# Patient Record
Sex: Male | Born: 1960 | Race: Black or African American | Hispanic: No | Smoking: Never smoker
Health system: Southern US, Community
[De-identification: ages and names within clinical notes are randomized; demographics above are authoritative.]

## PROBLEM LIST (undated history)

## (undated) DIAGNOSIS — T7840XA Allergy, unspecified, initial encounter: Secondary | ICD-10-CM

## (undated) HISTORY — PX: WISDOM TOOTH EXTRACTION: SHX21

## (undated) HISTORY — DX: Allergy, unspecified, initial encounter: T78.40XA

---

## 2013-10-20 HISTORY — PX: ANTERIOR CRUCIATE LIGAMENT REPAIR: SHX115

## 2014-04-30 ENCOUNTER — Emergency Department (HOSPITAL_BASED_OUTPATIENT_CLINIC_OR_DEPARTMENT_OTHER)
Admission: EM | Admit: 2014-04-30 | Discharge: 2014-04-30 | Disposition: A | Discharge status: Home / Self Care | Payer: BC Managed Care – PPO | Attending: Emergency Medicine | Admitting: Emergency Medicine

## 2014-04-30 ENCOUNTER — Emergency Department (HOSPITAL_BASED_OUTPATIENT_CLINIC_OR_DEPARTMENT_OTHER): Payer: BC Managed Care – PPO

## 2014-04-30 ENCOUNTER — Encounter (HOSPITAL_BASED_OUTPATIENT_CLINIC_OR_DEPARTMENT_OTHER): Payer: Self-pay | Admitting: Emergency Medicine

## 2014-04-30 DIAGNOSIS — IMO0002 Reserved for concepts with insufficient information to code with codable children: Secondary | ICD-10-CM | POA: Insufficient documentation

## 2014-04-30 DIAGNOSIS — S8392XA Sprain of unspecified site of left knee, initial encounter: Secondary | ICD-10-CM

## 2014-04-30 DIAGNOSIS — Y9351 Activity, roller skating (inline) and skateboarding: Secondary | ICD-10-CM | POA: Insufficient documentation

## 2014-04-30 DIAGNOSIS — M79669 Pain in unspecified lower leg: Secondary | ICD-10-CM

## 2014-04-30 DIAGNOSIS — Y929 Unspecified place or not applicable: Secondary | ICD-10-CM | POA: Insufficient documentation

## 2014-04-30 MED ORDER — IBUPROFEN 800 MG PO TABS: 800.0000 mg | ORAL_TABLET | Freq: Three times a day (TID) | ORAL | Status: DC

## 2014-04-30 NOTE — ED Provider Notes (Signed)
CSN: [id number]     Arrival date & time 04/30/14  1933 History   First MD Initiated Contact with Patient 04/30/14 1936     Chief Complaint  Patient presents with  . Fall     (Consider location/radiation/quality/duration/timing/severity/associated sxs/prior Treatment) Patient is a 53 y.o. male presenting with fall. The history is provided by the patient. No language interpreter was used.  Fall This is a new problem. The current episode started today. The problem occurs constantly. The problem has been rapidly worsening. Pertinent negatives include no joint swelling. Nothing aggravates the symptoms. He has tried nothing for the symptoms. The treatment provided no relief.  Pt  twisted lower leg on a skate board.  Pt complains of pain in his leg, below knee on outer aspect.   Knee feels like it gives away when he walks  History reviewed. No pertinent past medical history. History reviewed. No pertinent past surgical history. History reviewed. No pertinent family history. History  Substance Use Topics  . Smoking status: Never Smoker   . Smokeless tobacco: Not on file  . Alcohol Use: No    Review of Systems  Musculoskeletal: Negative for joint swelling.  All other systems reviewed and are negative.     Allergies  Review of patient's allergies indicates no known allergies.  Home Medications   Prior to Admission medications   Not on File   BP 146/94  Pulse 107  Temp(Src) 98 F (36.7 C) (Oral)  Resp 18  Ht 5\' 4"  (1.626 m)  Wt 190 lb (86.183 kg)  BMI 32.60 kg/m2  SpO2 100% Physical Exam  Vitals reviewed. Constitutional: He is oriented to person, place, and time. He appears well-developed and well-nourished.  Musculoskeletal: Normal range of motion. He exhibits tenderness.  Tender proximal lateral leg,  belows knee,  No effusion,   No med/lat loosening,  Negative drawer and Lachman'snv and ns intact  Neurological: He is alert and oriented to person, place, and time. He has  normal reflexes.  Skin: Skin is warm.  Psychiatric: He has a normal mood and affect.    ED Course  Procedures (including critical care time) Labs Review Labs Reviewed - No data to display  Imaging Review Dg Knee Complete 4 Views Left  04/30/2014   CLINICAL DATA:  Fall off skateboard.  Fell and twisted knee.  EXAM: LEFT KNEE - COMPLETE 4+ VIEW  COMPARISON:  None.  FINDINGS: Bandage material rounded distal femur. The knee is aligned. No evidence of joint effusion or fracture. Joint spaces are maintained. No focal soft tissue swelling is appreciated radiographically.  IMPRESSION: No acute bony abnormality or joint effusion.   Electronically Signed   By: Britta MccreedySusan  Turner M.D.   On: 04/30/2014 19:54     EKG Interpretation None      MDM   Final diagnoses:  Calf tenderness  Knee sprain and strain, left, initial encounter    Knee imbolizer Crutches Ice  Elevate See Dr. Shelle IronBeane for recheck this week    Elson AreasLeslie K Blenda Wisecup, PA-C 04/30/14 2032

## 2014-04-30 NOTE — ED Provider Notes (Signed)
Medical screening examination/treatment/procedure(s) were performed by non-physician practitioner and as supervising physician I was immediately available for consultation/collaboration.   EKG Interpretation None        Khiley Lieser, MD 04/30/14 2342 

## 2014-04-30 NOTE — ED Notes (Signed)
Pt reports he was skateboarding today and fell - c/o pain to left knee, reports difficulty applying weight.

## 2014-04-30 NOTE — Discharge Instructions (Signed)
Combined Knee Ligament Sprain °Combined knee ligament sprain is a tear of more than one of the major ligaments of the knee. The four knee ligaments are the anterior cruciate ligament (ACL), posterior cruciate ligament (PCL), medial collateral ligament (MCL) and lateral collateral ligament (LCL). Ligaments connect bones. They often cross a joint to hold the bones together. The ligaments of the knee keep the thigh bone (femur) and shinbone (tibia) in alignment. These ligaments allow the joint to move within a certain range of motion. Movement outside this range causes a ligament strain. Injury to multiple ligaments at the same time results in difficulty playing sports and in daily living. The most common multiple knee ligament injury involves the ACL and MCL. °SYMPTOMS  °· A "popping" sound heard or felt at the time of injury. °· Inability to continue activity after injury. °· Inflammation of the knee within 6 hours after injury. °· Possibly, deformity of the knee. °· Inability to straighten the knee. °· Feeling of the knee giving way or buckling. °· Sometimes, locking of the knee, if the joint cartilage (meniscus) is injured. °· Rarely, numbness, weakness, paralysis, discoloration, or coldness, due to nerve or blood vessel injury. °CAUSES  °Spraining of multiple ligaments occurs when a force is placed on the ligaments that exceeds their strength. This is often caused by a direct hit (trauma). It may also be caused by a non-contact injury (hyperextending the knee while twisting it).  °RISK INCREASES WITH: °· Contact sports (football, rugby, lacrosse). Sports that involve pivoting, jumping, cutting, or changing direction (basketball, gymnastics, soccer, volleyball). Sports on uneven ground (cross-country running, soccer). °· Poor strength and/or flexibility. °· Improper fitted or padded equipment. °PREVENTION °· Warm up and stretch properly before activity. °· Maintain physical fitness: °¨ Thigh, leg, and knee  flexibility. °¨ Muscle strength and endurance. °· Learn and use proper exercise technique. °· Wear proper and well fitting equipment (correct length of cleats for surface). °PROGNOSIS  °Without treatment, the knee will continue to give way and become vulnerable to recurring injury. Recurring injury can happen during athletics or daily living. If the injury includes damage to a nerve or artery, the chance of a poor outcome increases. Surgery is often needed to regain stability of the knee. °RELATED COMPLICATIONS  °Frequently recurring symptoms, including: °· Knee giving way. °· Joint instability. °· Inflammation. °· Injury to the joint cartilage (meniscus). This may result in locking and/or swelling of the knee. °· Injury to joint (articular) cartilage of the thigh bone or shinbone. This may result in arthritis of the knee. °· Injury to other ligaments of the knee. °· Knee stiffness (loss of knee motion). °· Permanent injury to nerves (numbness, weakness, or paralysis) or arteries. °· Removal (amputation) of the leg, due to nerve or artery injury. °TREATMENT  °Treatment first involves medicine and ice, to reduce pain and inflammation. Crutches may be advised, to decrease pain while walking. The knee may be restrained. Rehabilitation focuses on reducing swelling, regaining range of motion, and regaining muscle control and strength. It may also include receiving proper use training, wearing a brace, and education. (Avoid sports that involve pivoting, cutting, changing direction, jumping and landing). Surgery often offers the best chance for full recovery. Surgery from combined ACL/MCL injury involves replacement (reconstruction) of the ACL. This also allows for MCL healing. Despite surgery, some athletes may never return to their prior level of competition. The ability to return to sports depends on the related injuries and demands of the sport.  °MEDICATION  °·   If pain medicine is needed, nonsteroidal  anti-inflammatory medicines (aspirin and ibuprofen), or other minor pain relievers (acetaminophen), are often advised. °· Do not take pain medicine for 7 days before surgery. °· Stronger pain relievers may be prescribed. Use only as directed and only as much as you need. °· Contact your caregiver immediately if any bleeding, stomach upset, or signs of an allergic reaction occur. °COLD THERAPY  °Cold treatment (icing) should be applied for 10 to 15 minutes every 2 to 3 hours for inflammation and pain, and immediately after activity that aggravates your symptoms. Use ice packs or an ice massage. °SEEK MEDICAL CARE IF:  °· Symptoms get worse or do not improve in 6 weeks, despite treatment. °· After injury or surgery, any of the following occur: °¨ Pain, numbness, coldness, or a blue, gray, or dark color occurs in the foot or toenails. °¨ Increased pain, swelling, redness, drainage of fluids, or bleeding in the affected area. °¨ Signs of infection (headache, muscle aches, dizziness, or a general ill feeling with fever). °¨ New, unexplained symptoms develop. (Drugs used in treatment may produce side effects.) °Document Released: 10/06/2005 Document Revised: 12/29/2011 Document Reviewed: 01/18/2009 °Exit[employer]® Patient Information ©2015 [employer]. This information is not intended to replace advice given to you by your health care provider. Make sure you discuss any questions you have with your health care provider. ° °

## 2014-05-06 ENCOUNTER — Ambulatory Visit (HOSPITAL_BASED_OUTPATIENT_CLINIC_OR_DEPARTMENT_OTHER)
Admission: RE | Admit: 2014-05-06 | Discharge: 2014-05-06 | Disposition: A | Discharge status: Home / Self Care | Payer: BC Managed Care – PPO | Source: Ambulatory Visit | Attending: Physician Assistant | Admitting: Physician Assistant

## 2014-05-06 DIAGNOSIS — S83509A Sprain of unspecified cruciate ligament of unspecified knee, initial encounter: Secondary | ICD-10-CM | POA: Insufficient documentation

## 2014-05-06 DIAGNOSIS — M25469 Effusion, unspecified knee: Secondary | ICD-10-CM | POA: Diagnosis not present

## 2014-05-06 DIAGNOSIS — Y9351 Activity, roller skating (inline) and skateboarding: Secondary | ICD-10-CM | POA: Diagnosis not present

## 2014-05-06 DIAGNOSIS — IMO0002 Reserved for concepts with insufficient information to code with codable children: Secondary | ICD-10-CM | POA: Diagnosis not present

## 2014-05-06 DIAGNOSIS — X500XXA Overexertion from strenuous movement or load, initial encounter: Secondary | ICD-10-CM | POA: Diagnosis not present

## 2014-05-11 ENCOUNTER — Ambulatory Visit: Payer: BC Managed Care – PPO | Attending: Orthopedic Surgery | Admitting: Rehabilitation

## 2014-05-11 DIAGNOSIS — R262 Difficulty in walking, not elsewhere classified: Secondary | ICD-10-CM | POA: Diagnosis not present

## 2014-05-11 DIAGNOSIS — M25569 Pain in unspecified knee: Secondary | ICD-10-CM | POA: Insufficient documentation

## 2014-05-11 DIAGNOSIS — S83509A Sprain of unspecified cruciate ligament of unspecified knee, initial encounter: Secondary | ICD-10-CM | POA: Diagnosis not present

## 2014-05-11 DIAGNOSIS — X58XXXA Exposure to other specified factors, initial encounter: Secondary | ICD-10-CM | POA: Diagnosis not present

## 2014-05-11 DIAGNOSIS — IMO0001 Reserved for inherently not codable concepts without codable children: Secondary | ICD-10-CM | POA: Diagnosis present

## 2014-06-07 ENCOUNTER — Ambulatory Visit: Payer: BC Managed Care – PPO | Attending: Orthopedic Surgery | Admitting: Rehabilitation

## 2014-06-07 DIAGNOSIS — R262 Difficulty in walking, not elsewhere classified: Secondary | ICD-10-CM | POA: Insufficient documentation

## 2014-06-07 DIAGNOSIS — S83509A Sprain of unspecified cruciate ligament of unspecified knee, initial encounter: Secondary | ICD-10-CM | POA: Insufficient documentation

## 2014-06-07 DIAGNOSIS — M25569 Pain in unspecified knee: Secondary | ICD-10-CM | POA: Diagnosis not present

## 2014-06-07 DIAGNOSIS — X58XXXA Exposure to other specified factors, initial encounter: Secondary | ICD-10-CM | POA: Insufficient documentation

## 2014-06-07 DIAGNOSIS — IMO0001 Reserved for inherently not codable concepts without codable children: Secondary | ICD-10-CM | POA: Insufficient documentation

## 2014-06-12 ENCOUNTER — Ambulatory Visit: Payer: BC Managed Care – PPO | Admitting: Rehabilitation

## 2014-06-12 DIAGNOSIS — IMO0001 Reserved for inherently not codable concepts without codable children: Secondary | ICD-10-CM | POA: Diagnosis not present

## 2014-06-14 ENCOUNTER — Ambulatory Visit: Payer: BC Managed Care – PPO

## 2014-06-14 DIAGNOSIS — IMO0001 Reserved for inherently not codable concepts without codable children: Secondary | ICD-10-CM | POA: Diagnosis not present

## 2014-06-16 ENCOUNTER — Ambulatory Visit: Payer: BC Managed Care – PPO | Admitting: Rehabilitation

## 2014-06-16 DIAGNOSIS — IMO0001 Reserved for inherently not codable concepts without codable children: Secondary | ICD-10-CM | POA: Diagnosis not present

## 2014-06-19 ENCOUNTER — Ambulatory Visit: Payer: BC Managed Care – PPO | Admitting: Rehabilitation

## 2014-06-21 ENCOUNTER — Ambulatory Visit: Payer: BC Managed Care – PPO | Attending: Orthopedic Surgery | Admitting: Rehabilitation

## 2014-06-21 DIAGNOSIS — Z9889 Other specified postprocedural states: Secondary | ICD-10-CM | POA: Insufficient documentation

## 2014-06-21 DIAGNOSIS — M25569 Pain in unspecified knee: Secondary | ICD-10-CM | POA: Diagnosis not present

## 2014-06-21 DIAGNOSIS — R262 Difficulty in walking, not elsewhere classified: Secondary | ICD-10-CM | POA: Diagnosis not present

## 2014-06-21 DIAGNOSIS — IMO0001 Reserved for inherently not codable concepts without codable children: Secondary | ICD-10-CM | POA: Insufficient documentation

## 2014-06-23 ENCOUNTER — Ambulatory Visit: Payer: BC Managed Care – PPO | Admitting: Rehabilitation

## 2014-06-23 DIAGNOSIS — IMO0001 Reserved for inherently not codable concepts without codable children: Secondary | ICD-10-CM | POA: Diagnosis not present

## 2014-06-27 ENCOUNTER — Ambulatory Visit: Payer: BC Managed Care – PPO | Admitting: Physical Therapy

## 2014-06-27 DIAGNOSIS — IMO0001 Reserved for inherently not codable concepts without codable children: Secondary | ICD-10-CM | POA: Diagnosis not present

## 2014-06-29 ENCOUNTER — Ambulatory Visit: Payer: BC Managed Care – PPO | Admitting: Rehabilitation

## 2014-06-29 DIAGNOSIS — IMO0001 Reserved for inherently not codable concepts without codable children: Secondary | ICD-10-CM | POA: Diagnosis not present

## 2014-06-30 ENCOUNTER — Encounter: Payer: BC Managed Care – PPO | Admitting: Rehabilitation

## 2014-07-05 ENCOUNTER — Ambulatory Visit: Payer: BC Managed Care – PPO | Admitting: Physical Therapy

## 2014-07-07 ENCOUNTER — Ambulatory Visit: Payer: BC Managed Care – PPO | Admitting: Rehabilitation

## 2014-07-07 DIAGNOSIS — IMO0001 Reserved for inherently not codable concepts without codable children: Secondary | ICD-10-CM | POA: Diagnosis not present

## 2018-10-25 ENCOUNTER — Ambulatory Visit: Payer: BC Managed Care – PPO | Admitting: Family Medicine

## 2018-10-25 ENCOUNTER — Encounter: Payer: Self-pay | Admitting: Family Medicine

## 2018-10-25 VITALS — BP 112/84 | HR 77 | Temp 97.6°F | Ht 64.5 in | Wt 200.2 lb

## 2018-10-25 DIAGNOSIS — Z0000 Encounter for general adult medical examination without abnormal findings: Secondary | ICD-10-CM

## 2018-10-25 DIAGNOSIS — Z1211 Encounter for screening for malignant neoplasm of colon: Secondary | ICD-10-CM | POA: Diagnosis not present

## 2018-10-25 DIAGNOSIS — Z114 Encounter for screening for human immunodeficiency virus [HIV]: Secondary | ICD-10-CM | POA: Diagnosis not present

## 2018-10-25 DIAGNOSIS — Z125 Encounter for screening for malignant neoplasm of prostate: Secondary | ICD-10-CM

## 2018-10-25 DIAGNOSIS — Z1159 Encounter for screening for other viral diseases: Secondary | ICD-10-CM

## 2018-10-25 LAB — COMPREHENSIVE METABOLIC PANEL
ALT: 22 U/L (ref 0–53)
AST: 18 U/L (ref 0–37)
Albumin: 4.5 g/dL (ref 3.5–5.2)
Alkaline Phosphatase: 73 U/L (ref 39–117)
BUN: 14 mg/dL (ref 6–23)
CHLORIDE: 101 meq/L (ref 96–112)
CO2: 29 mEq/L (ref 19–32)
CREATININE: 0.98 mg/dL (ref 0.40–1.50)
Calcium: 9.2 mg/dL (ref 8.4–10.5)
GFR: 101.78 mL/min (ref >60.00)
Glucose, Bld: 103 mg/dL — ABNORMAL HIGH (ref 70–99)
Potassium: 4.4 mEq/L (ref 3.5–5.1)
SODIUM: 137 meq/L (ref 135–145)
Total Bilirubin: 0.5 mg/dL (ref 0.2–1.2)
Total Protein: 7.1 g/dL (ref 6.0–8.3)

## 2018-10-25 LAB — PSA: PSA: 1.76 ng/mL (ref 0.10–4.00)

## 2018-10-25 LAB — LIPID PANEL
Cholesterol: 260 mg/dL — ABNORMAL HIGH (ref 0–200)
HDL: 46.6 mg/dL (ref >39.00)
LDL CALC: 189 mg/dL — AB (ref 0–99)
NONHDL: 213.43
Total CHOL/HDL Ratio: 6
Triglycerides: 122 mg/dL (ref 0.0–149.0)
VLDL: 24.4 mg/dL (ref 0.0–40.0)

## 2018-10-25 MED ORDER — TRIAMCINOLONE ACETONIDE 0.1 % EX CREA: TOPICAL_CREAM | CUTANEOUS | 0 refills | Status: DC

## 2018-10-25 NOTE — Patient Instructions (Addendum)
Give [place] 2-3 business days to get the results of your labs back.   Keep the diet clean and stay active.  The new Shingrix vaccine (for shingles) is a 2 shot series. It can make people feel low energy, achy and almost like they have the flu for 48 hours after injection. Please plan accordingly when deciding on when to get this shot. Call our office for a nurse visit appointment to get this. The second shot of the series is less severe regarding the side effects, but it still lasts 48 hours.   If you do not hear anything about your referral in the next 1-2 weeks, call our office and ask for an update.  Keep tabs of the darker lesion on your finger. If there are any changes, let me know. Take a picture for reference. We have a picture in your file from today also.   Try the cream on area just inside the R ear.   Let us know if you need anything.

## 2018-10-25 NOTE — Progress Notes (Signed)
Chief Complaint  Patient presents with  . New Patient (Initial Visit)   Well Male Willie Vargas is here for a complete physical.   His last physical was >1 year ago.  Current diet: in general, a "healthy" diet.  Current exercise: none; active outside Weight trend: gained 10 lbs over past year Does pt snore? No. Daytime fatigue? No. Seat belt? Yes.    Health maintenance Shingrix- No Colonoscopy- No Tetanus- Yes HIV- No Hep C- No   Past Medical History:  Diagnosis Date  . Allergy       Past Surgical History:  Procedure Laterality Date  . ANTERIOR CRUCIATE LIGAMENT REPAIR  2015    Medications  Takes no meds routinely.    Allergies No Known Allergies  Family History Family History  Problem Relation Age of Onset  . COPD Mother   . Diabetes Mother   . Hyperlipidemia Mother   . Hypertension Mother   . Alcohol abuse Father   . Drug abuse Father   . Early death Father   . Heart disease Father   . Arthritis Maternal Grandmother   . Diabetes Maternal Grandmother   . Hypertension Maternal Grandmother   . Arthritis Paternal Grandmother   . Early death Paternal Grandmother   . Heart disease Paternal Grandmother     Review of Systems: Constitutional:  no fevers Eye:  no recent significant change in vision Ear/Nose/Mouth/Throat:  Ears:  no hearing loss Nose/Mouth/Throat:  no complaints of nasal congestion, no sore throat Cardiovascular:  no chest pain, no palpitations Respiratory:  no cough and no shortness of breath Gastrointestinal:  no abdominal pain, no change in bowel habits GU:  Male: negative for dysuria, frequency, and incontinence and negative for prostate symptoms Musculoskeletal/Extremities:  no pain, redness, or swelling of the joints Integumentary (Skin/Breast):  no abnormal skin lesions reported Neurologic:  no headaches Endocrine: No unexpected weight changes Hematologic/Lymphatic:  no abnormal bleeding  Exam BP 112/84 (BP Location: Left Arm,  Patient Position: Sitting, Cuff Size: Large)   Pulse 77   Temp 97.6 F (36.4 C) (Oral)   Ht 5' 4.5" (1.638 m)   Wt 200 lb 4 oz (90.8 kg)   SpO2 99%   BMI 33.84 kg/m  General:  well developed, well nourished, in no apparent distress Skin:  no significant moles, warts, or growths Head:  no masses, lesions, or tenderness Eyes:  pupils equal and round, sclera anicteric without injection Ears:  canals without lesions, TMs shiny without retraction, no obvious effusion, no erythema Nose:  nares patent, septum midline, mucosa normal Throat/Pharynx:  lips and gingiva without lesion; tongue and uvula midline; non-inflamed pharynx; no exudates or postnasal drainage Neck: neck supple without adenopathy, thyromegaly, or masses Lungs:  clear to auscultation, breath sounds equal bilaterally, no respiratory distress Cardio:  regular rate and rhythm, no LE edema, no bruits Abdomen:  abdomen soft, nontender; bowel sounds normal; no masses or organomegaly Genital (male): Uncircumcised penis, no lesions or discharge; testes present bilaterally without masses or tenderness Rectal: Deferred Musculoskeletal:  symmetrical muscle groups noted without atrophy or deformity Extremities:  no clubbing, cyanosis, or edema, no deformities, no skin discoloration Neuro:  gait normal; deep tendon reflexes normal and symmetric Psych: well oriented with normal range of affect and appropriate judgment/insight   L lateral 3rd digit  Assessment and Plan  Well adult exam - Plan: Comprehensive metabolic panel, Lipid panel  Screen for colon cancer - Plan: Ambulatory referral to Gastroenterology  Screening for HIV (human immunodeficiency virus) -  Plan: HIV Antibody (routine testing w rflx)  Screening for prostate cancer - Plan: PSA  Encounter for hepatitis C screening test for low risk patient - Plan: Hepatitis C antibody   Well 58 y.o. male. Counseled on diet and exercise. Counseled on risks and benefits of  prostate cancer screening with PSA. The patient agrees to undergo testing. Shingrix info given.  Immunizations, labs, and further orders as above. Follow up in 1 yr. The patient voiced understanding and agreement to the plan.  Jilda Roche [place], DO 10/25/18 10:02 AM

## 2018-10-25 NOTE — Progress Notes (Signed)
Pre visit review using our clinic review tool, if applicable. No additional management support is needed unless otherwise documented below in the visit note. 

## 2018-10-26 LAB — HIV ANTIBODY (ROUTINE TESTING W REFLEX): HIV 1&2 Ab, 4th Generation: NONREACTIVE

## 2018-10-26 LAB — HEPATITIS C ANTIBODY
HEP C AB: NONREACTIVE
SIGNAL TO CUT-OFF: 0.08 (ref <1.00)

## 2018-10-27 ENCOUNTER — Other Ambulatory Visit: Payer: Self-pay | Admitting: Family Medicine

## 2018-10-27 DIAGNOSIS — R739 Hyperglycemia, unspecified: Secondary | ICD-10-CM

## 2018-10-27 DIAGNOSIS — E782 Mixed hyperlipidemia: Secondary | ICD-10-CM

## 2018-11-11 ENCOUNTER — Encounter: Payer: Self-pay | Admitting: Gastroenterology

## 2018-11-11 ENCOUNTER — Encounter: Payer: Self-pay | Admitting: Family Medicine

## 2018-11-17 ENCOUNTER — Other Ambulatory Visit: Payer: Self-pay | Admitting: Family Medicine

## 2018-11-17 DIAGNOSIS — Z1211 Encounter for screening for malignant neoplasm of colon: Secondary | ICD-10-CM

## 2018-11-29 ENCOUNTER — Other Ambulatory Visit (INDEPENDENT_AMBULATORY_CARE_PROVIDER_SITE_OTHER): Payer: BC Managed Care – PPO

## 2018-11-29 ENCOUNTER — Other Ambulatory Visit: Payer: Self-pay | Admitting: Family Medicine

## 2018-11-29 DIAGNOSIS — R739 Hyperglycemia, unspecified: Secondary | ICD-10-CM

## 2018-11-29 DIAGNOSIS — E782 Mixed hyperlipidemia: Secondary | ICD-10-CM

## 2018-11-29 DIAGNOSIS — R7303 Prediabetes: Secondary | ICD-10-CM

## 2018-11-29 DIAGNOSIS — E785 Hyperlipidemia, unspecified: Secondary | ICD-10-CM

## 2018-11-29 LAB — LIPID PANEL
CHOL/HDL RATIO: 6
Cholesterol: 212 mg/dL — ABNORMAL HIGH (ref 0–200)
HDL: 35.3 mg/dL — ABNORMAL LOW (ref >39.00)
LDL Cholesterol: 158 mg/dL — ABNORMAL HIGH (ref 0–99)
NonHDL: 177.11
Triglycerides: 96 mg/dL (ref 0.0–149.0)
VLDL: 19.2 mg/dL (ref 0.0–40.0)

## 2018-11-29 LAB — HEMOGLOBIN A1C: HEMOGLOBIN A1C: 6 % (ref 4.6–6.5)

## 2018-12-02 ENCOUNTER — Ambulatory Visit (AMBULATORY_SURGERY_CENTER): Payer: Self-pay | Admitting: *Deleted

## 2018-12-02 ENCOUNTER — Encounter: Payer: Self-pay | Admitting: Gastroenterology

## 2018-12-02 VITALS — Ht 64.0 in | Wt 200.0 lb

## 2018-12-02 DIAGNOSIS — Z1211 Encounter for screening for malignant neoplasm of colon: Secondary | ICD-10-CM

## 2018-12-02 MED ORDER — NA SULFATE-K SULFATE-MG SULF 17.5-3.13-1.6 GM/177ML PO SOLN: ORAL | 0 refills | Status: DC

## 2018-12-02 NOTE — Progress Notes (Signed)
Patient denies any allergies to eggs or soy. Patient denies any problems with anesthesia/sedation. Patient denies any oxygen use at home. Patient denies taking any diet/weight loss medications or blood thinners. EMMI education offered, pt declined. suprep $15 off coupon printed and given to pt.

## 2018-12-07 ENCOUNTER — Ambulatory Visit (INDEPENDENT_AMBULATORY_CARE_PROVIDER_SITE_OTHER): Payer: BC Managed Care – PPO | Admitting: *Deleted

## 2018-12-07 ENCOUNTER — Ambulatory Visit: Payer: BC Managed Care – PPO

## 2018-12-07 DIAGNOSIS — Z23 Encounter for immunization: Secondary | ICD-10-CM | POA: Diagnosis not present

## 2018-12-07 NOTE — Progress Notes (Signed)
Patient here for tdap vaccine  Immunization given and patient tolerated well.

## 2018-12-16 ENCOUNTER — Ambulatory Visit (AMBULATORY_SURGERY_CENTER): Payer: BC Managed Care – PPO | Admitting: Gastroenterology

## 2018-12-16 ENCOUNTER — Encounter: Payer: Self-pay | Admitting: Gastroenterology

## 2018-12-16 ENCOUNTER — Other Ambulatory Visit: Payer: Self-pay

## 2018-12-16 VITALS — BP 167/94 | HR 66 | Temp 97.3°F | Resp 16 | Ht 64.0 in | Wt 200.0 lb

## 2018-12-16 DIAGNOSIS — Z1211 Encounter for screening for malignant neoplasm of colon: Secondary | ICD-10-CM

## 2018-12-16 MED ORDER — SODIUM CHLORIDE 0.9 % IV SOLN: 500.0000 mL | Freq: Once | INTRAVENOUS | Status: DC

## 2018-12-16 NOTE — Patient Instructions (Signed)
  HANDOUTS GIVEN: DIVERTICULOSIS AND HIGH FIBER dIET   YOU HAD AN ENDOSCOPIC PROCEDURE TODAY AT THE [facility]:   Refer to the procedure report that was given to you for any specific questions about what was found during the examination.  If the procedure report does not answer your questions, please call your gastroenterologist to clarify.  If you requested that your care partner not be given the details of your procedure findings, then the procedure report has been included in a sealed envelope for you to review at your convenience later.  YOU SHOULD EXPECT: Some feelings of bloating in the abdomen. Passage of more gas than usual.  Walking can help get rid of the air that was put into your GI tract during the procedure and reduce the bloating. If you had a lower endoscopy (such as a colonoscopy or flexible sigmoidoscopy) you may notice spotting of blood in your stool or on the toilet paper. If you underwent a bowel prep for your procedure, you may not have a normal bowel movement for a few days.  Please Note:  You might notice some irritation and congestion in your nose or some drainage.  This is from the oxygen used during your procedure.  There is no need for concern and it should clear up in a day or so.  SYMPTOMS TO REPORT IMMEDIATELY:   Following lower endoscopy (colonoscopy or flexible sigmoidoscopy):  Excessive amounts of blood in the stool  Significant tenderness or worsening of abdominal pains  Swelling of the abdomen that is new, acute  Fever of 100F or higher   For urgent or emergent issues, a gastroenterologist can be reached at any hour by calling [phone].   DIET:  We do recommend a small meal at first, but then you may proceed to your regular diet.  Drink plenty of fluids but you should avoid alcoholic beverages for 24 hours.  ACTIVITY:  You should plan to take it easy for the rest of today and you should NOT DRIVE or use heavy machinery until tomorrow  (because of the sedation medicines used during the test).    FOLLOW UP: Our staff will call the number listed on your records the next business day following your procedure to check on you and address any questions or concerns that you may have regarding the information given to you following your procedure. If we do not reach you, we will leave a message.  However, if you are feeling well and you are not experiencing any problems, there is no need to return our call.  We will assume that you have returned to your regular daily activities without incident.  If any biopsies were taken you will be contacted by phone or by letter within the next 1-3 weeks.  Please call us at [phone] if you have not heard about the biopsies in 3 weeks.    SIGNATURES/CONFIDENTIALITY: You and/or your care partner have signed paperwork which will be entered into your electronic medical record.  These signatures attest to the fact that that the information above on your After Visit Summary has been reviewed and is understood.  Full responsibility of the confidentiality of this discharge information lies with you and/or your care-partner.

## 2018-12-16 NOTE — Progress Notes (Signed)
Report to PACU, RN, vss, BBS= Clear.  

## 2018-12-16 NOTE — Op Note (Signed)
[facility] Patient Name: Willie PaganiniWayne Vargas Procedure Date: 12/16/2018 8:26 AM MRN: [id number] Endoscopist: Lynann Bolognaajesh Crosby Bevan , MD Age: 58 Referring MD:  Date of Birth: 03/26/1961 Gender: Male Account #: [id number] Procedure:                Colonoscopy Indications:              Screening for colorectal malignant neoplasm Medicines:                Monitored Anesthesia Care Procedure:                Pre-Anesthesia Assessment:                           - Prior to the procedure, a History and Physical                            was performed, and patient medications and                            allergies were reviewed. The patient's tolerance of                            previous anesthesia was also reviewed. The risks                            and benefits of the procedure and the sedation                            options and risks were discussed with the patient.                            All questions were answered, and informed consent                            was obtained. Prior Anticoagulants: The patient has                            taken no previous anticoagulant or antiplatelet                            agents. ASA Grade Assessment: I - A normal, healthy                            patient. After reviewing the risks and benefits,                            the patient was deemed in satisfactory condition to                            undergo the procedure.                           After obtaining informed consent, the colonoscope  was passed under direct vision. Throughout the                            procedure, the patient's blood pressure, pulse, and                            oxygen saturations were monitored continuously. The                            Model CF-HQ190L [id number]) scope was introduced                            through the anus and advanced to the 2 cm into the                            ileum. The colonoscopy was  performed without                            difficulty. The patient tolerated the procedure                            well. The quality of the bowel preparation was                            excellent. Scope In: 8:30:58 AM Scope Out: 8:44:40 AM Scope Withdrawal Time: 0 hours 9 minutes 34 seconds  Total Procedure Duration: 0 hours 13 minutes 42 seconds  Findings:                 A few small-mouthed diverticula were found in the                            sigmoid colon, descending colon and ascending colon.                           Minimal internal hemorrhoids.                           The terminal ileum appeared normal.                           The exam was otherwise without abnormality on                            direct and retroflexion views. Complications:            No immediate complications. Estimated Blood Loss:     Estimated blood loss: none. Impression:               -Mild pancolonic diverticulosis predominantly in                            the left colon.                           -Otherwise normal colonoscopy to TI. Recommendation:           -  Patient has a contact number available for                            emergencies. The signs and symptoms of potential                            delayed complications were discussed with the                            patient. Return to normal activities tomorrow.                            Written discharge instructions were provided to the                            patient.                           - High fiber diet.                           - Continue present medications.                           - Repeat colonoscopy in 10 years for screening                            purposes. Earlier, if with any new problems or if                            there is any change in family history.                           - Return to GI clinic PRN. Lynann Bologna, MD 12/16/2018 8:49:10 AM This report has been signed electronically.

## 2018-12-16 NOTE — Progress Notes (Signed)
Pt's states no medical or surgical changes since previsit or office visit. OK for student to be in procedure room.

## 2018-12-17 ENCOUNTER — Telehealth: Payer: Self-pay

## 2018-12-17 NOTE — Telephone Encounter (Signed)
  Follow up Call-  Call back number 12/16/2018  Post procedure Call Back phone  # [phone]  Permission to leave phone message Yes  Some recent data might be hidden     Patient questions: 2Do you have a fever, pain , or abdominal swelling? No. Pain Score  0 *  Have you tolerated food without any problems? Yes.    Have you been able to return to your normal activities? Yes.    Do you have any questions about your discharge instructions: Diet   No. Medications  No. Follow up visit  No.  Do you have questions or concerns about your Care? No.  Actions: * If pain score is 4 or above: No action needed, pain <4.

## 2019-03-30 ENCOUNTER — Other Ambulatory Visit: Payer: Self-pay

## 2019-03-30 ENCOUNTER — Other Ambulatory Visit (INDEPENDENT_AMBULATORY_CARE_PROVIDER_SITE_OTHER): Payer: BC Managed Care – PPO

## 2019-03-30 DIAGNOSIS — E785 Hyperlipidemia, unspecified: Secondary | ICD-10-CM

## 2019-03-30 DIAGNOSIS — R7303 Prediabetes: Secondary | ICD-10-CM

## 2019-03-30 LAB — HEMOGLOBIN A1C: Hgb A1c MFr Bld: 6.3 % (ref 4.6–6.5)

## 2019-03-30 LAB — LIPID PANEL
Cholesterol: 216 mg/dL — ABNORMAL HIGH (ref 0–200)
HDL: 43.5 mg/dL (ref >39.00)
LDL Cholesterol: 159 mg/dL — ABNORMAL HIGH (ref 0–99)
NonHDL: 172.92
Total CHOL/HDL Ratio: 5
Triglycerides: 71 mg/dL (ref 0.0–149.0)
VLDL: 14.2 mg/dL (ref 0.0–40.0)

## 2019-05-18 ENCOUNTER — Other Ambulatory Visit: Payer: Self-pay

## 2019-05-18 ENCOUNTER — Ambulatory Visit: Payer: BC Managed Care – PPO | Admitting: Family Medicine

## 2019-05-18 ENCOUNTER — Encounter: Payer: Self-pay | Admitting: Family Medicine

## 2019-05-18 VITALS — BP 138/84 | HR 91 | Temp 97.9°F | Ht 64.5 in | Wt 199.4 lb

## 2019-05-18 DIAGNOSIS — R7303 Prediabetes: Secondary | ICD-10-CM

## 2019-05-18 DIAGNOSIS — L989 Disorder of the skin and subcutaneous tissue, unspecified: Secondary | ICD-10-CM | POA: Diagnosis not present

## 2019-05-18 DIAGNOSIS — E782 Mixed hyperlipidemia: Secondary | ICD-10-CM

## 2019-05-18 NOTE — Progress Notes (Signed)
Chief Complaint  Patient presents with  . Results    cholesterol    Subjective: Patient is a 58 y.o. male here for f/u labs.  Labs showed hyperlipidemia and a1c of 6.3 from 6.0. Since hearing this news, he has increased his physical activity and also cut down on sweets. He has lost a couple lbs, feels better.   Skin lesion on L forearm. No pain, sometimes sore but he does prod at it. No drainage, redness, known injury. Not changing. Would eventually like it off.   ROS: Heart: Denies chest pain  Lungs: Denies SOB   Past Medical History:  Diagnosis Date  . Allergy     Objective: BP 138/84 (BP Location: Left Arm, Patient Position: Sitting, Cuff Size: Large)   Pulse 91   Temp 97.9 F (36.6 C) (Oral)   Ht 5' 4.5" (1.638 m)   Wt 199 lb 6 oz (90.4 kg)   SpO2 95%   BMI 33.69 kg/m  General: Awake, appears stated age HEENT: MMM, EOMi Heart: RRR, no bruits, no LE edema Lungs: CTAB, no rales, wheezes or rhonchi. No accessory muscle use Skin: Flesh colored lesion that is elliptically shaped measuring 0.6 x 1.2 cm in diameter. No erythema, excessive warmth, fluctuance, drainage, scaling.  Psych: Age appropriate judgment and insight, normal affect and mood  Assessment and Plan: Prediabetes - Plan: Hemoglobin A1c, ck in 2 mo  Mixed hyperlipidemia - Plan: Lipid panel, ck in 2 mo  Skin lesion - Plan: offered removal vs referral to derm; he will let us know  F/u in 2 mo for labs; I will plan on things being improved given his dietary changes and have him in for CPE in Jan as originally scheduled.  The patient voiced understanding and agreement to the plan.  [place], DO 05/18/19  9:52 AM

## 2019-05-18 NOTE — Patient Instructions (Addendum)
Give [place] 2-3 business days to get the results of your labs back.   Keep the diet clean and stay active. Strong work.   If you want the area on your forearm taken care of, let us know. We can do it here or we can send you to the dermatologist.   Let us know if you need anything.

## 2019-07-20 ENCOUNTER — Other Ambulatory Visit: Payer: BC Managed Care – PPO

## 2019-08-16 ENCOUNTER — Other Ambulatory Visit: Payer: Self-pay

## 2019-08-16 ENCOUNTER — Ambulatory Visit (INDEPENDENT_AMBULATORY_CARE_PROVIDER_SITE_OTHER): Payer: BC Managed Care – PPO

## 2019-08-16 DIAGNOSIS — Z23 Encounter for immunization: Secondary | ICD-10-CM

## 2019-10-27 ENCOUNTER — Encounter: Payer: BC Managed Care – PPO | Admitting: Family Medicine

## 2019-10-31 ENCOUNTER — Other Ambulatory Visit: Payer: Self-pay

## 2019-10-31 ENCOUNTER — Other Ambulatory Visit (INDEPENDENT_AMBULATORY_CARE_PROVIDER_SITE_OTHER): Payer: BC Managed Care – PPO

## 2019-10-31 DIAGNOSIS — E782 Mixed hyperlipidemia: Secondary | ICD-10-CM

## 2019-10-31 DIAGNOSIS — R7303 Prediabetes: Secondary | ICD-10-CM

## 2019-10-31 LAB — LIPID PANEL
Cholesterol: 225 mg/dL — ABNORMAL HIGH (ref 0–200)
HDL: 42 mg/dL (ref >39.00)
LDL Cholesterol: 166 mg/dL — ABNORMAL HIGH (ref 0–99)
NonHDL: 182.64
Total CHOL/HDL Ratio: 5
Triglycerides: 85 mg/dL (ref 0.0–149.0)
VLDL: 17 mg/dL (ref 0.0–40.0)

## 2019-10-31 LAB — HEMOGLOBIN A1C: Hgb A1c MFr Bld: 6.2 % (ref 4.6–6.5)

## 2019-11-02 ENCOUNTER — Encounter: Payer: BC Managed Care – PPO | Admitting: Family Medicine

## 2019-11-02 DIAGNOSIS — Z0289 Encounter for other administrative examinations: Secondary | ICD-10-CM

## 2020-02-27 ENCOUNTER — Telehealth: Payer: Self-pay

## 2020-02-27 NOTE — Telephone Encounter (Addendum)
Patient was told by billing to give our office a call about a billing issue with our office. DOS 11/02/2019 for a physical for a no show. Per the patient he received a voice mail on his phone stating that Dr. Carmelia Roller would not be in office on the DOS 11/02/2019 Per the patient he never came in for the appointment and he received a bill for it. The patient has the voice mail still saved in his cell phone from that day on 11/01/2019 at 1 pm is when he received the message. The patient wants to talk to an office manager about this issue. Please call the patient back at [phone] as soon as possible.

## 2020-02-28 NOTE — Telephone Encounter (Signed)
Spoke with patient to let him know the message he received on 11/01/19 was stating that his visit for 11/02/19 was changed from an in person visit to a virtual office visit due to Covid (patient has [employer] and a CPE could be done virtually during that time).  I did let the patient know I would send over an email to charge correction asking them to remove the $50.00 no show fee for the 11/02/19 DOS.

## 2020-12-12 ENCOUNTER — Encounter: Payer: Self-pay | Admitting: Family Medicine

## 2020-12-12 ENCOUNTER — Other Ambulatory Visit: Payer: Self-pay

## 2020-12-12 ENCOUNTER — Ambulatory Visit: Payer: BC Managed Care – PPO | Admitting: Family Medicine

## 2020-12-12 VITALS — BP 150/92 | HR 57 | Temp 98.2°F | Ht 64.5 in | Wt 199.1 lb

## 2020-12-12 DIAGNOSIS — R000 Tachycardia, unspecified: Secondary | ICD-10-CM

## 2020-12-12 DIAGNOSIS — I10 Essential (primary) hypertension: Secondary | ICD-10-CM

## 2020-12-12 MED ORDER — AMLODIPINE BESYLATE 5 MG PO TABS: 5.0000 mg | ORAL_TABLET | Freq: Every day | ORAL | 3 refills | Status: DC

## 2020-12-12 NOTE — Patient Instructions (Signed)
Give [place] 2-3 business days to get the results of your labs back.   Keep the diet clean and stay active.  If you do not hear anything about your referral in the next 1-2 weeks, call our office and ask for an update.  Check your blood pressures 2-3 times per week, alternating the time of day you check it. If it is high, considering waiting 1-2 minutes and rechecking. If it gets higher, your anxiety is likely creeping up and we should avoid rechecking.   Let us know if you need anything.

## 2020-12-12 NOTE — Progress Notes (Signed)
Chief Complaint  Patient presents with  . Hypertension    Subjective [name] is a 60 y.o. male who presents for hypertension follow up. Here w his spouse.  He does monitor home blood pressures. Blood pressures ranging from 160-180's/90's on average. He is not on any medication.  He is usually adhering to a healthy diet overall. Current exercise: cardio, lifting, Cross Fit No Cp or SOB.  He does not snore at night.    Past Medical History:  Diagnosis Date  . Allergy     Exam BP (!) 150/92 (BP Location: Left Arm, Patient Position: Sitting, Cuff Size: Normal)   Pulse (!) 57   Temp 98.2 F (36.8 C) (Oral)   Ht 5' 4.5" (1.638 m)   Wt 199 lb 2 oz (90.3 kg)   SpO2 95%   BMI 33.65 kg/m  General:  well developed, well nourished, in no apparent distress Heart: RRR, no bruits, no LE edema Lungs: clear to auscultation, no accessory muscle use Psych: well oriented with normal range of affect and appropriate judgment/insight  Rapid heart beat - Plan: EKG 12-Lead  Essential hypertension - Plan: EKG 12-Lead  1. Refer cards for possible longer term monitor.  He has been asymptomatic but reports his heart rates have been in the 150s-170s range.  At one point it was 198.  EKG shows NSR, normal axis, no interval abnormalities, no ST segment or T wave changes, good R wave progression.  2. Start Norvasc 5 mg daily.  Counseled on diet and exercise.  Continue to monitor blood pressure at home. F/u in 1 month recheck blood pressure. The patient voiced understanding and agreement to the plan.  [name], DO 12/12/20  3:45 PM

## 2020-12-13 LAB — COMPREHENSIVE METABOLIC PANEL
ALT: 22 U/L (ref 0–53)
AST: 20 U/L (ref 0–37)
Albumin: 4.4 g/dL (ref 3.5–5.2)
Alkaline Phosphatase: 79 U/L (ref 39–117)
BUN: 19 mg/dL (ref 6–23)
CO2: 32 mEq/L (ref 19–32)
Calcium: 9.2 mg/dL (ref 8.4–10.5)
Chloride: 100 mEq/L (ref 96–112)
Creatinine, Ser: 1.12 mg/dL (ref 0.40–1.50)
GFR: 72.64 mL/min (ref >60.00)
Glucose, Bld: 100 mg/dL — ABNORMAL HIGH (ref 70–99)
Potassium: 4.3 mEq/L (ref 3.5–5.1)
Sodium: 138 mEq/L (ref 135–145)
Total Bilirubin: 0.4 mg/dL (ref 0.2–1.2)
Total Protein: 7.1 g/dL (ref 6.0–8.3)

## 2020-12-13 LAB — CBC
HCT: 41.3 % (ref 39.0–52.0)
Hemoglobin: 14.2 g/dL (ref 13.0–17.0)
MCHC: 34.5 g/dL (ref 30.0–36.0)
MCV: 91.8 fl (ref 78.0–100.0)
Platelets: 308 10*3/uL (ref 150.0–400.0)
RBC: 4.5 Mil/uL (ref 4.22–5.81)
RDW: 13.1 % (ref 11.5–15.5)
WBC: 6.7 10*3/uL (ref 4.0–10.5)

## 2020-12-13 LAB — TSH: TSH: 0.92 u[IU]/mL (ref 0.35–4.50)

## 2020-12-13 LAB — MAGNESIUM: Magnesium: 2.4 mg/dL (ref 1.5–2.5)

## 2020-12-18 DIAGNOSIS — T7840XA Allergy, unspecified, initial encounter: Secondary | ICD-10-CM | POA: Insufficient documentation

## 2020-12-26 ENCOUNTER — Other Ambulatory Visit: Payer: Self-pay

## 2020-12-26 ENCOUNTER — Encounter: Payer: Self-pay | Admitting: Cardiology

## 2020-12-26 ENCOUNTER — Ambulatory Visit: Payer: BC Managed Care – PPO | Admitting: Cardiology

## 2020-12-26 ENCOUNTER — Ambulatory Visit (INDEPENDENT_AMBULATORY_CARE_PROVIDER_SITE_OTHER): Payer: BC Managed Care – PPO

## 2020-12-26 DIAGNOSIS — R002 Palpitations: Secondary | ICD-10-CM

## 2020-12-26 DIAGNOSIS — E785 Hyperlipidemia, unspecified: Secondary | ICD-10-CM

## 2020-12-26 DIAGNOSIS — I10 Essential (primary) hypertension: Secondary | ICD-10-CM | POA: Insufficient documentation

## 2020-12-26 HISTORY — DX: Hyperlipidemia, unspecified: E78.5

## 2020-12-26 HISTORY — DX: Palpitations: R00.2

## 2020-12-26 HISTORY — DX: Essential (primary) hypertension: I10

## 2020-12-26 NOTE — Patient Instructions (Signed)
Medication Instructions:  Your physician recommends that you continue on your current medications as directed. Please refer to the Current Medication list given to you today.  *If you need a refill on your cardiac medications before your next appointment, please call your pharmacy*   Lab Work: None If you have labs (blood work) drawn today and your tests are completely normal, you will receive your results only by: Marland Kitchen MyChart Message (if you have MyChart) OR . A paper copy in the mail If you have any lab test that is abnormal or we need to change your treatment, we will call you to review the results.   Testing/Procedures: Your physician has requested that you have an echocardiogram. Echocardiography is a painless test that uses sound waves to create images of your heart. It provides your doctor with information about the size and shape of your heart and how well your heart's chambers and valves are working. This procedure takes approximately one hour. There are no restrictions for this procedure.  A zio monitor was ordered today. It will remain on for 7 days. You will then return monitor and event diary in provided box. It takes 1-2 weeks for report to be downloaded and returned to [place]. We will call you with the results. If monitor falls off or has orange flashing light, please call Zio for further instructions.   Non-Cardiac CT scanning, (CAT scanning), is a noninvasive, special x-ray that produces cross-sectional images of the body using x-rays and a computer. CT scans help physicians diagnose and treat medical conditions. For some CT exams, a contrast material is used to enhance visibility in the area of the body being studied. CT scans provide greater clarity and reveal more details than regular x-ray exams.     Follow-Up: At [facility], you and your health needs are our priority.  As part of our continuing mission to provide you with exceptional heart care, we have created designated  Provider Care Teams.  These Care Teams include your primary Cardiologist (physician) and Advanced Practice Providers (APPs -  Physician Assistants and Nurse Practitioners) who all work together to provide you with the care you need, when you need it.  We recommend signing up for the patient portal called "MyChart".  Sign up information is provided on this After Visit Summary.  MyChart is used to connect with patients for Virtual Visits (Telemedicine).  Patients are able to view lab/test results, encounter notes, upcoming appointments, etc.  Non-urgent messages can be sent to your provider as well.   To learn more about what you can do with MyChart, go to [email or web address].    Your next appointment:   6 week(s)  The format for your next appointment:   In Person  Provider:   Gypsy Balsam, MD   Other Instructions   Echocardiogram An echocardiogram is a test that uses sound waves (ultrasound) to produce images of the heart. Images from an echocardiogram can provide important information about:  Heart size and shape.  The size and thickness and movement of your heart's walls.  Heart muscle function and strength.  Heart valve function or if you have stenosis. Stenosis is when the heart valves are too narrow.  If blood is flowing backward through the heart valves (regurgitation).  A tumor or infectious growth around the heart valves.  Areas of heart muscle that are not working well because of poor blood flow or injury from a heart attack.  Aneurysm detection. An aneurysm is a weak or damaged part  of an artery wall. The wall bulges out from the normal force of blood pumping through the body. Tell a health care provider about:  Any allergies you have.  All medicines you are taking, including vitamins, herbs, eye drops, creams, and over-the-counter medicines.  Any blood disorders you have.  Any surgeries you have had.  Any medical conditions you have.  Whether you are  pregnant or may be pregnant. What are the risks? Generally, this is a safe test. However, problems may occur, including an allergic reaction to dye (contrast) that may be used during the test. What happens before the test? No specific preparation is needed. You may eat and drink normally. What happens during the test?  You will take off your clothes from the waist up and put on a hospital gown.  Electrodes or electrocardiogram (ECG)patches may be placed on your chest. The electrodes or patches are then connected to a device that monitors your heart rate and rhythm.  You will lie down on a table for an ultrasound exam. A gel will be applied to your chest to help sound waves pass through your skin.  A handheld device, called a transducer, will be pressed against your chest and moved over your heart. The transducer produces sound waves that travel to your heart and bounce back (or "echo" back) to the transducer. These sound waves will be captured in real-time and changed into images of your heart that can be viewed on a video monitor. The images will be recorded on a computer and reviewed by your health care provider.  You may be asked to change positions or hold your breath for a short time. This makes it easier to get different views or better views of your heart.  In some cases, you may receive contrast through an IV in one of your veins. This can improve the quality of the pictures from your heart. The procedure may vary among health care providers and hospitals.   What can I expect after the test? You may return to your normal, everyday life, including diet, activities, and medicines, unless your health care provider tells you not to do that. Follow these instructions at home:  It is up to you to get the results of your test. Ask your health care provider, or the department that is doing the test, when your results will be ready.  Keep all follow-up visits. This is  important. Summary  An echocardiogram is a test that uses sound waves (ultrasound) to produce images of the heart.  Images from an echocardiogram can provide important information about the size and shape of your heart, heart muscle function, heart valve function, and other possible heart problems.  You do not need to do anything to prepare before this test. You may eat and drink normally.  After the echocardiogram is completed, you may return to your normal, everyday life, unless your health care provider tells you not to do that. This information is not intended to replace advice given to you by your health care provider. Make sure you discuss any questions you have with your health care provider. Document Revised: 05/29/2020 Document Reviewed: 05/29/2020 Elsevier Patient Education  2021 [employer].

## 2020-12-26 NOTE — Progress Notes (Signed)
Cardiology Consultation:    Date:  12/26/2020   ID:  Willie Vargas, DOB 25-Apr-1961, MRN [id number]  PCP:  Willie Dory, DO  Cardiologist:  Willie Balsam, MD   Referring MD: Willie Vargas*   Chief Complaint  Patient presents with  . Tachycardia    History of Present Illness:    Willie Vargas is a 60 y.o. male who is being seen today for the evaluation of tachycardia at the request of Willie Vargas*.  He comes today to my office with his ex-wife.  She is a retired Engineer, civil (consulting) and she noticed that he does have elevated heart rate.  Actually she check his pulse oximetry and one time her heart rate was around 1 60-1 70.  She also listened to his chest and she felt her heart rate speeding up, he was completely asymptomatic, there was no chest pain no tightness no squeezing or pressure no burning in the chest.  He exercised on the regular basis but for last 3 weeks he did not do much because was concerned about he is tachycardia.  He does have a Fitbit which tells him what the heart rate is and he notices that at rest usually 70 when he walks to move around 90-100 he is concerned about it.  He denies having any chest pain tightness squeezing pressure burning chest no palpitations that she feels, no dizziness no swelling of lower extremities.  No passing out. He does not smoke, never did He does not drink alcohol Does not have family history of premature coronary disease He is a retired Contractor.  Past Medical History:  Diagnosis Date  . Allergy     Past Surgical History:  Procedure Laterality Date  . ANTERIOR CRUCIATE LIGAMENT REPAIR  2015  . WISDOM TOOTH EXTRACTION      Current Medications: Current Meds  Medication Sig  . amLODipine (NORVASC) 5 MG tablet Take 1 tablet (5 mg total) by mouth daily.  . cetirizine (ZYRTEC) 10 MG tablet Take 10 mg by mouth daily.  . Multiple Vitamins-Minerals (MULTIVITAMIN ADULTS PO) Take by mouth daily.     Allergies:    Patient has no known allergies.   Social History   Socioeconomic History  . Marital status: Married    Spouse name: Not on file  . Number of children: Not on file  . Years of education: Not on file  . Highest education level: Not on file  Occupational History  . Not on file  Tobacco Use  . Smoking status: Never Smoker  . Smokeless tobacco: Never Used  Vaping Use  . Vaping Use: Never used  Substance and Sexual Activity  . Alcohol use: No  . Drug use: No  . Sexual activity: Not on file  Other Topics Concern  . Not on file  Social History Narrative  . Not on file   Social Determinants of Health   Financial Resource Strain: Not on file  Food Insecurity: Not on file  Transportation Needs: Not on file  Physical Activity: Not on file  Stress: Not on file  Social Connections: Not on file     Family History: The patient's family history includes Alcohol abuse in his father; Arthritis in his maternal grandmother and paternal grandmother; COPD in his mother; Colon polyps in his brother; Diabetes in his brother, maternal grandmother, and mother; Drug abuse in his father; Early death in his father and paternal grandmother; Heart disease in his father and paternal grandmother; Hyperlipidemia in his  mother; Hypertension in his maternal grandmother and mother. There is no history of Colon cancer, Esophageal cancer, Rectal cancer, or Stomach cancer. ROS:   Please see the history of present illness.    All 14 point review of systems negative except as described per history of present illness.  EKGs/Labs/Other Studies Reviewed:    The following studies were reviewed today:   EKG:  EKG is  ordered today.  The ekg ordered today demonstrates normal sinus rhythm, normal P interval, PVCs, nonspecific ST segment changes, no evidence of left ventricle hypertrophy  Recent Labs: 12/12/2020: ALT 22; BUN 19; Creatinine, Ser 1.12; Hemoglobin 14.2; Magnesium 2.4; Platelets 308.0; Potassium 4.3;  Sodium 138; TSH 0.92  Recent Lipid Panel    Component Value Date/Time   CHOL 225 (H) 10/31/2019 0839   TRIG 85.0 10/31/2019 0839   HDL 42.00 10/31/2019 0839   CHOLHDL 5 10/31/2019 0839   VLDL 17.0 10/31/2019 0839   LDLCALC 166 (H) 10/31/2019 0839    Physical Exam:    VS:  BP (!) 144/72 (BP Location: Right Arm, Patient Position: Sitting)   Pulse 71   Ht 5\' 4"  (1.626 m)   Wt 198 lb (89.8 kg)   SpO2 95%   BMI 33.99 kg/m     Wt Readings from Last 3 Encounters:  12/26/20 198 lb (89.8 kg)  12/12/20 199 lb 2 oz (90.3 kg)  05/18/19 199 lb 6 oz (90.4 kg)     GEN:  Well nourished, well developed in no acute distress HEENT: Normal NECK: No JVD; No carotid bruits LYMPHATICS: No lymphadenopathy CARDIAC: RRR, no murmurs, no rubs, no gallops RESPIRATORY:  Clear to auscultation without rales, wheezing or rhonchi  ABDOMEN: Soft, non-tender, non-distended MUSCULOSKELETAL:  No edema; No deformity  SKIN: Warm and dry NEUROLOGIC:  Alert and oriented x 3 PSYCHIATRIC:  Normal affect   ASSESSMENT:    1. Palpitations   2. Dyslipidemia   3. Essential hypertension    PLAN:    In order of problems listed above:  1. Palpitations.  Actually he does not feel he is ex-wife records he is heart rate being elevated to the situation that he should not be  I will ask him to wear Zio patch for week to see if you have any significant arrhythmia.  His wife also tell me that before there was situation his heart rate was too slow in the 50s. 2. Dyslipidemia I did calculate his 10 years risk which is 16%.  We will initiated conversation about potentially taking cholesterol medication however he is afraid to do it therefore, we decided to do coronary calcium score and based on that we will decide if we need to do some cholesterol medication. 3. Essential hypertension 4. New diagnosis, he is being initiated with amlodipine 5 mg daily he has been taking this for last few days blood pressure is better but  still not well controlled.  Again will get an echocardiogram to check left ventricle ejection fraction as well as look for left ventricle hypertrophy and based on that we will decide how aggressive we need to be with the therapy.  EKG did not show any evidence of LVH. 5. We will also get a calcium score to determine if we need to treat his dyslipidemia.   Medication Adjustments/Labs and Tests Ordered: Current medicines are reviewed at length with the patient today.  Concerns regarding medicines are outlined above.  No orders of the defined types were placed in this encounter.  No orders of  the defined types were placed in this encounter.   Signed, Georgeanna Lea, MD, [facility]. 12/26/2020 3:23 PM    [facility]

## 2021-01-09 ENCOUNTER — Other Ambulatory Visit: Payer: Self-pay

## 2021-01-09 ENCOUNTER — Encounter: Payer: Self-pay | Admitting: Family Medicine

## 2021-01-09 ENCOUNTER — Ambulatory Visit: Payer: BC Managed Care – PPO | Admitting: Family Medicine

## 2021-01-09 VITALS — BP 142/90 | HR 90 | Temp 98.1°F | Ht 64.5 in | Wt 200.0 lb

## 2021-01-09 DIAGNOSIS — L309 Dermatitis, unspecified: Secondary | ICD-10-CM

## 2021-01-09 DIAGNOSIS — I10 Essential (primary) hypertension: Secondary | ICD-10-CM

## 2021-01-09 MED ORDER — TRIAMCINOLONE ACETONIDE 0.1 % EX CREA: 1.0000 "application " | TOPICAL_CREAM | Freq: Two times a day (BID) | CUTANEOUS | 0 refills | Status: DC

## 2021-01-09 MED ORDER — AMLODIPINE BESYLATE 5 MG PO TABS: 10.0000 mg | ORAL_TABLET | Freq: Every day | ORAL | 3 refills | Status: DC

## 2021-01-09 NOTE — Patient Instructions (Addendum)
Take 2 tabs of the Norvasc until you run out.   Keep the diet clean and stay active.  Keep monitoring your blood pressure.  Let me know if you get swelling in your legs.  Use the cream on a Q tip to apply to the itchy area.   Let us know if you need anything.

## 2021-01-09 NOTE — Progress Notes (Signed)
Chief Complaint  Patient presents with  . Follow-up    Subjective Willie Vargas is a 60 y.o. male who presents for hypertension follow up. He does monitor home blood pressures. Blood pressures ranging from 130-140's/80-90's on average. He is compliant with medication- Norvasc 5 mg/d. Patient has these side effects of medication: none He is normally adhering to a healthy diet overall. Current exercise: active at home No CP or SOB.   Itching o fR ear that comes and goes. He does take Zyrtec daily for allergies. He has used Kenalog in past that helped a little.    Past Medical History:  Diagnosis Date  . Allergy     Exam BP (!) 142/90 (BP Location: Left Arm, Patient Position: Sitting, Cuff Size: Normal)   Pulse 90   Temp 98.1 F (36.7 C) (Oral)   Ht 5' 4.5" (1.638 m)   Wt 200 lb (90.7 kg)   SpO2 97%   BMI 33.80 kg/m  General:  well developed, well nourished, in no apparent distress  HEENT: I do not appreciate an external ear lesions, there is some scaling on the floor of the R ear canal. Neg TM b/l, neg canal on L Heart: RRR, no bruits, no LE edema Lungs: clear to auscultation, no accessory muscle use Psych: well oriented with normal range of affect and appropriate judgment/insight  Essential hypertension - Plan: amLODipine (NORVASC) 5 MG tablet  Dermatitis - Plan: triamcinolone (KENALOG) 0.1 %  1. Status: Chronic, uncontrolled. Increase Norvasc from 5 mg/d to 10 mg/d. Cont to monitor BP at home. Counseled on diet and exercise. 2. Kenalog cream on Q tip, avoid itching.  F/u in 6 weeks to reck BP. The patient voiced understanding and agreement to the plan.  Jilda Roche [place], DO 01/09/21  3:47 PM

## 2021-01-10 ENCOUNTER — Ambulatory Visit (INDEPENDENT_AMBULATORY_CARE_PROVIDER_SITE_OTHER)
Admission: RE | Admit: 2021-01-10 | Discharge: 2021-01-10 | Disposition: A | Discharge status: Home / Self Care | Payer: Self-pay | Source: Ambulatory Visit | Attending: Cardiology | Admitting: Cardiology

## 2021-01-10 DIAGNOSIS — R002 Palpitations: Secondary | ICD-10-CM

## 2021-01-10 DIAGNOSIS — I10 Essential (primary) hypertension: Secondary | ICD-10-CM

## 2021-01-10 DIAGNOSIS — E785 Hyperlipidemia, unspecified: Secondary | ICD-10-CM

## 2021-01-11 ENCOUNTER — Telehealth: Payer: Self-pay

## 2021-01-11 NOTE — Telephone Encounter (Signed)
Patient notified of test results and verbalized understanding. I also r/s his 04/25 appt per office request

## 2021-01-11 NOTE — Telephone Encounter (Signed)
-----   Message from Georgeanna Lea, MD sent at 01/10/2021 11:40 AM EDT ----- Fairly benign Holter, only one episode of short supraventricular tachycardia only 6 beats.  No need to treat

## 2021-01-28 ENCOUNTER — Other Ambulatory Visit: Payer: Self-pay

## 2021-01-28 ENCOUNTER — Ambulatory Visit (HOSPITAL_BASED_OUTPATIENT_CLINIC_OR_DEPARTMENT_OTHER)
Admission: RE | Admit: 2021-01-28 | Discharge: 2021-01-28 | Disposition: A | Discharge status: Home / Self Care | Payer: BC Managed Care – PPO | Source: Ambulatory Visit | Attending: Cardiology | Admitting: Cardiology

## 2021-01-28 DIAGNOSIS — R002 Palpitations: Secondary | ICD-10-CM | POA: Insufficient documentation

## 2021-01-28 DIAGNOSIS — E785 Hyperlipidemia, unspecified: Secondary | ICD-10-CM | POA: Insufficient documentation

## 2021-01-28 DIAGNOSIS — I10 Essential (primary) hypertension: Secondary | ICD-10-CM | POA: Diagnosis not present

## 2021-01-28 LAB — ECHOCARDIOGRAM COMPLETE
Area-P 1/2: 2.91 cm2
S' Lateral: 3.07 cm

## 2021-02-06 ENCOUNTER — Telehealth: Payer: Self-pay | Admitting: Cardiology

## 2021-02-06 NOTE — Telephone Encounter (Signed)
Patient returning call for echo results. 

## 2021-02-06 NOTE — Telephone Encounter (Signed)
Spoke to patient informed him of results.  

## 2021-02-07 ENCOUNTER — Other Ambulatory Visit (HOSPITAL_BASED_OUTPATIENT_CLINIC_OR_DEPARTMENT_OTHER): Payer: BC Managed Care – PPO

## 2021-02-11 ENCOUNTER — Ambulatory Visit: Payer: BC Managed Care – PPO | Admitting: Cardiology

## 2021-02-20 ENCOUNTER — Ambulatory Visit: Payer: BC Managed Care – PPO | Admitting: Cardiology

## 2021-02-20 ENCOUNTER — Other Ambulatory Visit: Payer: Self-pay

## 2021-02-20 ENCOUNTER — Encounter: Payer: Self-pay | Admitting: Cardiology

## 2021-02-20 VITALS — BP 130/72 | HR 93 | Ht 66.0 in | Wt 194.0 lb

## 2021-02-20 DIAGNOSIS — I10 Essential (primary) hypertension: Secondary | ICD-10-CM | POA: Diagnosis not present

## 2021-02-20 DIAGNOSIS — R931 Abnormal findings on diagnostic imaging of heart and coronary circulation: Secondary | ICD-10-CM | POA: Diagnosis not present

## 2021-02-20 DIAGNOSIS — E785 Hyperlipidemia, unspecified: Secondary | ICD-10-CM | POA: Diagnosis not present

## 2021-02-20 DIAGNOSIS — R002 Palpitations: Secondary | ICD-10-CM | POA: Diagnosis not present

## 2021-02-20 NOTE — Patient Instructions (Signed)

## 2021-02-20 NOTE — Progress Notes (Signed)
Cardiology Office Note:    Date:  02/20/2021   ID:  Willie Vargas, DOB 28-Dec-1960, MRN [id number]  PCP:  Sharlene Dory, DO  Cardiologist:  Gypsy Balsam, MD    Referring MD: Sharlene Dory*   Chief Complaint  Patient presents with  . BP normalizing    History of Present Illness:    Willie Vargas is a 60 y.o. male who initially was referred to [place] because of some palpitations there is quite interesting apparently his ex-wife was able to record him some devices his heart rate being fast.  He did not have any symptoms from that.  We did also calculated his 10 years predicted risk for coronary artery disease which was more than 10% and we decided to do calcium score to try to help [place] with management of his hyperlipidemia. He comes today 2 months for follow-up overall he seems to be doing well.  He denies have any palpitations there is no chest pain tightness squeezing pressure burning chest.  Overall doing well.  Past Medical History:  Diagnosis Date  . Allergy   . Dyslipidemia 12/26/2020  . Essential hypertension 12/26/2020  . Palpitations 12/26/2020    Past Surgical History:  Procedure Laterality Date  . ANTERIOR CRUCIATE LIGAMENT REPAIR  2015  . WISDOM TOOTH EXTRACTION      Current Medications: Current Meds  Medication Sig  . amLODipine (NORVASC) 5 MG tablet Take 2 tablets (10 mg total) by mouth daily. (Patient taking differently: Take 5 mg by mouth daily.)  . cetirizine (ZYRTEC) 10 MG tablet Take 10 mg by mouth daily.  . Multiple Vitamins-Minerals (MULTIVITAMIN ADULTS PO) Take 1 tablet by mouth every other day.     Allergies:   Patient has no known allergies.   Social History   Socioeconomic History  . Marital status: Married    Spouse name: Not on file  . Number of children: Not on file  . Years of education: Not on file  . Highest education level: Not on file  Occupational History  . Not on file  Tobacco Use  . Smoking status: Never Smoker  .  Smokeless tobacco: Never Used  Vaping Use  . Vaping Use: Never used  Substance and Sexual Activity  . Alcohol use: No  . Drug use: No  . Sexual activity: Not on file  Other Topics Concern  . Not on file  Social History Narrative  . Not on file   Social Determinants of Health   Financial Resource Strain: Not on file  Food Insecurity: Not on file  Transportation Needs: Not on file  Physical Activity: Not on file  Stress: Not on file  Social Connections: Not on file     Family History: The patient's family history includes Alcohol abuse in his father; Arthritis in his maternal grandmother and paternal grandmother; COPD in his mother; Colon polyps in his brother; Diabetes in his brother, maternal grandmother, and mother; Drug abuse in his father; Early death in his father and paternal grandmother; Heart disease in his father and paternal grandmother; Hyperlipidemia in his mother; Hypertension in his maternal grandmother and mother. There is no history of Colon cancer, Esophageal cancer, Rectal cancer, or Stomach cancer. ROS:   Please see the history of present illness.    All 14 point review of systems negative except as described per history of present illness  EKGs/Labs/Other Studies Reviewed:      Recent Labs: 12/12/2020: ALT 22; BUN 19; Creatinine, Ser 1.12; Hemoglobin 14.2;  Magnesium 2.4; Platelets 308.0; Potassium 4.3; Sodium 138; TSH 0.92  Recent Lipid Panel    Component Value Date/Time   CHOL 225 (H) 10/31/2019 0839   TRIG 85.0 10/31/2019 0839   HDL 42.00 10/31/2019 0839   CHOLHDL 5 10/31/2019 0839   VLDL 17.0 10/31/2019 0839   LDLCALC 166 (H) 10/31/2019 0839    Physical Exam:    VS:  BP 130/72 (BP Location: Right Arm, Patient Position: Sitting)   Pulse 93   Ht 5\' 6"  (1.676 m)   Wt 194 lb (88 kg)   SpO2 96%   BMI 31.31 kg/m     Wt Readings from Last 3 Encounters:  02/20/21 194 lb (88 kg)  01/09/21 200 lb (90.7 kg)  12/26/20 198 lb (89.8 kg)     GEN:   Well nourished, well developed in no acute distress HEENT: Normal NECK: No JVD; No carotid bruits LYMPHATICS: No lymphadenopathy CARDIAC: RRR, no murmurs, no rubs, no gallops RESPIRATORY:  Clear to auscultation without rales, wheezing or rhonchi  ABDOMEN: Soft, non-tender, non-distended MUSCULOSKELETAL:  No edema; No deformity  SKIN: Warm and dry LOWER EXTREMITIES: no swelling NEUROLOGIC:  Alert and oriented x 3 PSYCHIATRIC:  Normal affect   ASSESSMENT:    1. Essential hypertension   2. Palpitations   3. Dyslipidemia   4. Elevated coronary artery calcium score of 11    PLAN:    In order of problems listed above:  1. Essential hypertension his blood pressure seems to be well controlled he is tolerating medication quite well.  We will continue. 2. Palpitations his monitor did not show any arrhythmia therefore there is nothing to treat.  I told him if he develop some palpitation he need to let me know. 3. Dyslipidemia, his calcium score is 11.1 he is 10 years predicted risk is more than 10% which is intermediate.  Ideally he need to be taking cholesterol medication.  However he prefers to exercise first and diet first before committing himself to medications.  She is calcium score is positive but with a very low I think is acceptable option. 4. Elevated calcium score only 11 do not think he is risk factors modifications. 5. He did have echocardiogram showed preserved left ventricle ejection fraction without significant pathology.   Medication Adjustments/Labs and Tests Ordered: Current medicines are reviewed at length with the patient today.  Concerns regarding medicines are outlined above.  No orders of the defined types were placed in this encounter.  Medication changes: No orders of the defined types were placed in this encounter.   Signed, 02/25/21, MD, [facility] 02/20/2021 2:46 PM    [facility]

## 2021-03-05 ENCOUNTER — Other Ambulatory Visit: Payer: Self-pay | Admitting: Family Medicine

## 2021-03-05 DIAGNOSIS — I10 Essential (primary) hypertension: Secondary | ICD-10-CM

## 2021-03-31 ENCOUNTER — Other Ambulatory Visit: Payer: Self-pay | Admitting: Family Medicine

## 2021-03-31 DIAGNOSIS — I10 Essential (primary) hypertension: Secondary | ICD-10-CM

## 2021-04-12 ENCOUNTER — Other Ambulatory Visit: Payer: Self-pay | Admitting: Family Medicine

## 2021-04-12 DIAGNOSIS — I10 Essential (primary) hypertension: Secondary | ICD-10-CM

## 2021-08-06 DIAGNOSIS — H2510 Age-related nuclear cataract, unspecified eye: Secondary | ICD-10-CM

## 2021-08-06 HISTORY — DX: Age-related nuclear cataract, unspecified eye: H25.10

## 2021-10-07 ENCOUNTER — Other Ambulatory Visit: Payer: Self-pay | Admitting: Family Medicine

## 2021-10-07 DIAGNOSIS — I10 Essential (primary) hypertension: Secondary | ICD-10-CM

## 2021-11-22 ENCOUNTER — Ambulatory Visit: Payer: BC Managed Care – PPO | Admitting: Cardiology

## 2021-12-27 ENCOUNTER — Other Ambulatory Visit: Payer: Self-pay

## 2021-12-27 ENCOUNTER — Ambulatory Visit: Payer: BC Managed Care – PPO | Admitting: Cardiology

## 2021-12-27 ENCOUNTER — Encounter: Payer: Self-pay | Admitting: Cardiology

## 2021-12-27 VITALS — BP 132/76 | HR 75 | Ht 64.75 in | Wt 188.0 lb

## 2021-12-27 DIAGNOSIS — R002 Palpitations: Secondary | ICD-10-CM

## 2021-12-27 DIAGNOSIS — R931 Abnormal findings on diagnostic imaging of heart and coronary circulation: Secondary | ICD-10-CM

## 2021-12-27 DIAGNOSIS — E785 Hyperlipidemia, unspecified: Secondary | ICD-10-CM

## 2021-12-27 DIAGNOSIS — I10 Essential (primary) hypertension: Secondary | ICD-10-CM

## 2021-12-27 NOTE — Patient Instructions (Signed)
Medication Instructions:  ?Your physician recommends that you continue on your current medications as directed. Please refer to the Current Medication list given to you today. ? ?*If you need a refill on your cardiac medications before your next appointment, please call your pharmacy* ? ? ?Lab Work: ?Your physician recommends that you return for lab work in: Direct LDL today ?If you have labs (blood work) drawn today and your tests are completely normal, you will receive your results only by: ?MyChart Message (if you have MyChart) OR ?A paper copy in the mail ?If you have any lab test that is abnormal or we need to change your treatment, we will call you to review the results. ? ? ?Testing/Procedures: ?None ? ? ?Follow-Up: ?At [facility], you and your health needs are our priority.  As part of our continuing mission to provide you with exceptional heart care, we have created designated Provider Care Teams.  These Care Teams include your primary Cardiologist (physician) and Advanced Practice Providers (APPs -  Physician Assistants and Nurse Practitioners) who all work together to provide you with the care you need, when you need it. ? ?We recommend signing up for the patient portal called "MyChart".  Sign up information is provided on this After Visit Summary.  MyChart is used to connect with patients for Virtual Visits (Telemedicine).  Patients are able to view lab/test results, encounter notes, upcoming appointments, etc.  Non-urgent messages can be sent to your provider as well.   ?To learn more about what you can do with MyChart, go to [email or web address].   ? ?Your next appointment:   ?1 year(s) ? ?The format for your next appointment:   ?In Person ? ?Provider:   ?Gypsy Balsam, MD  ? ? ?Other Instructions ?  ?

## 2021-12-27 NOTE — Progress Notes (Signed)
?Cardiology Office Note:   ? ?Date:  12/27/2021  ? ?ID:  Willie Vargas, DOB 1961-10-20, MRN [id number] ? ?PCP:  Sharlene Dory, DO  ?Cardiologist:  Gypsy Balsam, MD   ? ?Referring MD: Sharlene Dory*  ? ?Chief Complaint  ?Patient presents with  ? Follow-up  ?I am doing fine ? ?History of Present Illness:   ? ?Willie Vargas is a 61 y.o. male who was initially referred to [place] because of palpitations it was quite interesting.  Apparently his ex-wife was able to record some abnormality of heart beats on device.  After that monitor being placed was negative.  We did cause some scar which showed relatively low number his overall 10 years predicted risk was 10 we started a conversation about potentially starting cholesterol medication he wanted to do exercises and try to see how things are going.  He comes today to my office doing well denies have any chest pain tightness squeezing pressure mid chest no palpitations dizziness swelling of lower extremities. ? ?Past Medical History:  ?Diagnosis Date  ? Allergy   ? Dyslipidemia 12/26/2020  ? Essential hypertension 12/26/2020  ? Palpitations 12/26/2020  ? Senile nuclear sclerosis 08/06/2021  ? ? ?Past Surgical History:  ?Procedure Laterality Date  ? ANTERIOR CRUCIATE LIGAMENT REPAIR  2015  ? WISDOM TOOTH EXTRACTION    ? ? ?Current Medications: ?Current Meds  ?Medication Sig  ? amLODipine (NORVASC) 5 MG tablet TAKE 1 TABLET (5 MG TOTAL) BY MOUTH DAILY. (Patient taking differently: Take 5 mg by mouth daily.)  ? cetirizine (ZYRTEC) 10 MG tablet Take 10 mg by mouth daily.  ? Multiple Vitamins-Minerals (MULTIVITAMIN ADULTS PO) Take 1 tablet by mouth every other day.  ?  ? ?Allergies:   Patient has no known allergies.  ? ?Social History  ? ?Socioeconomic History  ? Marital status: Legally Separated  ?  Spouse name: Not on file  ? Number of children: Not on file  ? Years of education: Not on file  ? Highest education level: Not on file  ?Occupational History  ? Not on  file  ?Tobacco Use  ? Smoking status: Never  ? Smokeless tobacco: Never  ?Vaping Use  ? Vaping Use: Never used  ?Substance and Sexual Activity  ? Alcohol use: No  ? Drug use: No  ? Sexual activity: Not on file  ?Other Topics Concern  ? Not on file  ?Social History Narrative  ? Not on file  ? ?Social Determinants of Health  ? ?Financial Resource Strain: Not on file  ?Food Insecurity: Not on file  ?Transportation Needs: Not on file  ?Physical Activity: Not on file  ?Stress: Not on file  ?Social Connections: Not on file  ?  ? ?Family History: ?The patient's family history includes Alcohol abuse in his father; Arthritis in his maternal grandmother and paternal grandmother; COPD in his mother; Colon polyps in his brother; Diabetes in his brother, maternal grandmother, and mother; Drug abuse in his father; Early death in his father and paternal grandmother; Heart disease in his father and paternal grandmother; Hyperlipidemia in his mother; Hypertension in his maternal grandmother and mother. There is no history of Colon cancer, Esophageal cancer, Rectal cancer, or Stomach cancer. ?ROS:   ?Please see the history of present illness.    ?All 14 point review of systems negative except as described per history of present illness ? ?EKGs/Labs/Other Studies Reviewed:   ? ? ? ?Recent Labs: ?No results found for requested labs within last  8760 hours.  ?Recent Lipid Panel ?   ?Component Value Date/Time  ? CHOL 225 (H) 10/31/2019 0839  ? TRIG 85.0 10/31/2019 0839  ? HDL 42.00 10/31/2019 0839  ? CHOLHDL 5 10/31/2019 0839  ? VLDL 17.0 10/31/2019 0839  ? LDLCALC 166 (H) 10/31/2019 6237  ? ? ?Physical Exam:   ? ?VS:  BP 132/76 (BP Location: Left Arm, Patient Position: Sitting)   Pulse 75   Ht 5' 4.75" (1.645 m)   Wt 188 lb (85.3 kg)   SpO2 96%   BMI 31.53 kg/m?    ? ?Wt Readings from Last 3 Encounters:  ?12/27/21 188 lb (85.3 kg)  ?02/20/21 194 lb (88 kg)  ?01/09/21 200 lb (90.7 kg)  ?  ? ?GEN:  Well nourished, well developed in no  acute distress ?HEENT: Normal ?NECK: No JVD; No carotid bruits ?LYMPHATICS: No lymphadenopathy ?CARDIAC: RRR, no murmurs, no rubs, no gallops ?RESPIRATORY:  Clear to auscultation without rales, wheezing or rhonchi  ?ABDOMEN: Soft, non-tender, non-distended ?MUSCULOSKELETAL:  No edema; No deformity  ?SKIN: Warm and dry ?LOWER EXTREMITIES: no swelling ?NEUROLOGIC:  Alert and oriented x 3 ?PSYCHIATRIC:  Normal affect  ? ?ASSESSMENT:   ? ?1. Essential hypertension   ?2. Elevated coronary artery calcium score of 11   ?3. Palpitations   ?4. Dyslipidemia   ? ?PLAN:   ? ?In order of problems listed above: ? ?Essential hypertension: Blood pressure well controlled continue present management. ?Elevated calcium score.  I will check his fasting lipid profile ?Dyslipidemia I did review K PN from January 2021 LDL 166 HDL 42.  I will check his direct LDL today. ?Palpitations: Denies having any. ? ? ?Medication Adjustments/Labs and Tests Ordered: ?Current medicines are reviewed at length with the patient today.  Concerns regarding medicines are outlined above.  ?No orders of the defined types were placed in this encounter. ? ?Medication changes: No orders of the defined types were placed in this encounter. ? ? ?Signed, ?Georgeanna Lea, MD, [facility] ?12/27/2021 11:29 AM    ?[facility] ?

## 2021-12-28 LAB — LDL CHOLESTEROL, DIRECT: LDL Direct: 119 mg/dL — ABNORMAL HIGH (ref 0–99)

## 2021-12-28 LAB — SPECIMEN STATUS REPORT

## 2022-04-03 ENCOUNTER — Other Ambulatory Visit: Payer: Self-pay | Admitting: Family Medicine

## 2022-04-03 DIAGNOSIS — I10 Essential (primary) hypertension: Secondary | ICD-10-CM

## 2022-08-31 IMAGING — CT CT CARDIAC CORONARY ARTERY CALCIUM SCORE
3 series · 14 of 20 positions shown, 15 images · non-contrast
Comparison: None.
COMPARISON: None.
COMPARISON: None.

Addendum:
EXAM:
OVER-READ INTERPRETATION  CT CHEST

The following report is an over-read performed by radiologist Dr.
Kasukona Masiku [REDACTED] on 01/10/2021. This
over-read does not include interpretation of cardiac or coronary
anatomy or pathology. The coronary calcium score/coronary CTA
interpretation by the cardiologist is attached.
CLINICAL DATA: Risk stratification
Coronary Calcium Score
TECHNIQUE: The patient was scanned on a Siemens Force scanner. Axial
non-contrast 3 mm slices were carried out through the heart. The
data set was analyzed on a dedicated work station and scored using
the Agatson method.

[Series 2: casc 3.0 bv41 2 bestsyst 34 % · axial · 0.37mm/px · z∈[-310,-247]mm · 4 of 36 slices shown, 5 images]
[im 8/36  vessel]
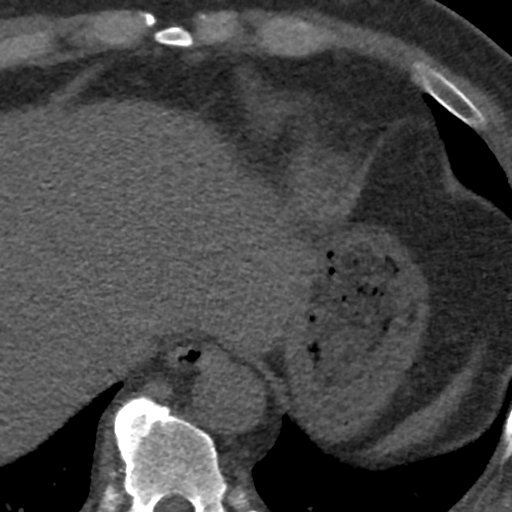
[im 8/36  lung]
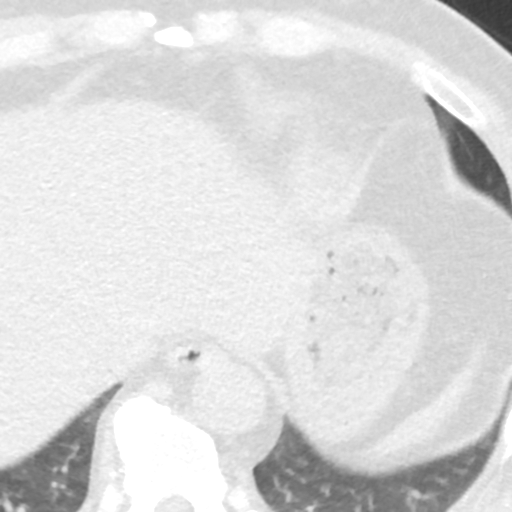
[im 15/36  vessel]
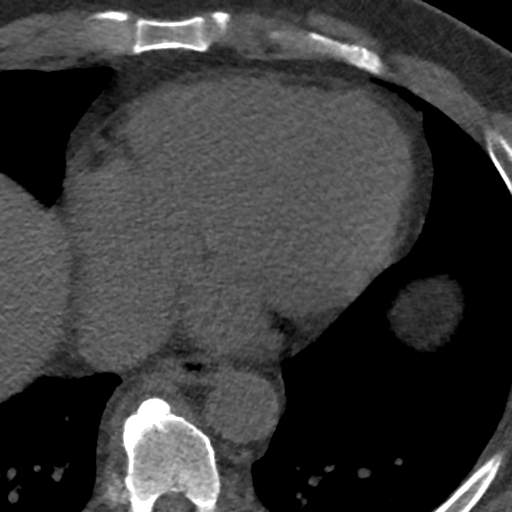
[im 22/36  vessel]
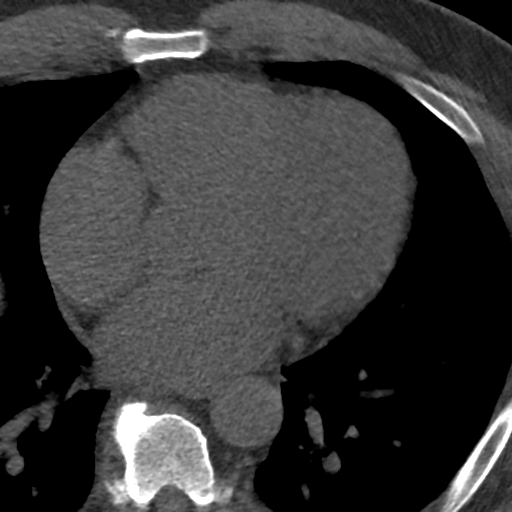
[im 29/36  vessel]
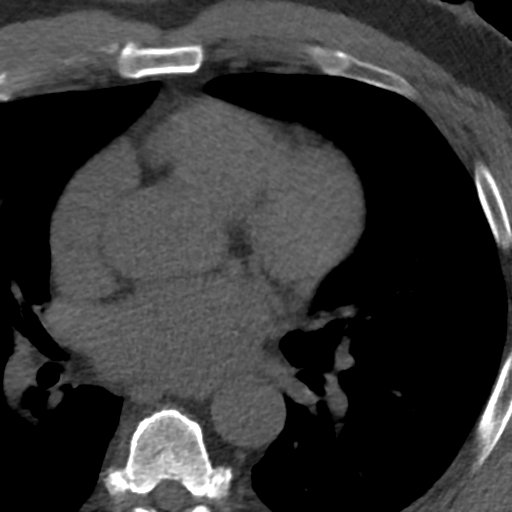

[Series 3: lung 36 % · axial · 0.68mm/px · z∈[-316,-244]mm · 5 of 36 slices shown]
[im 6/36  lung]
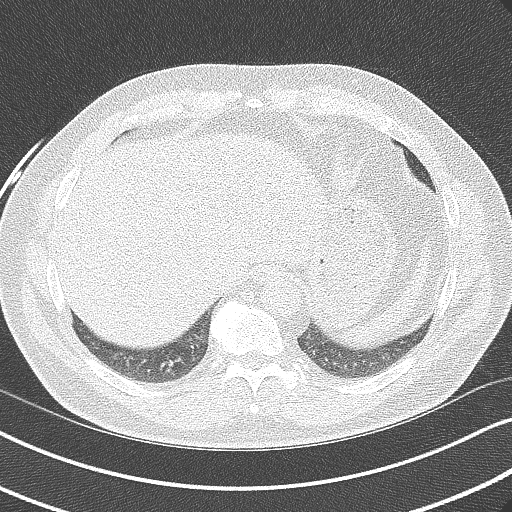
[im 12/36  lung]
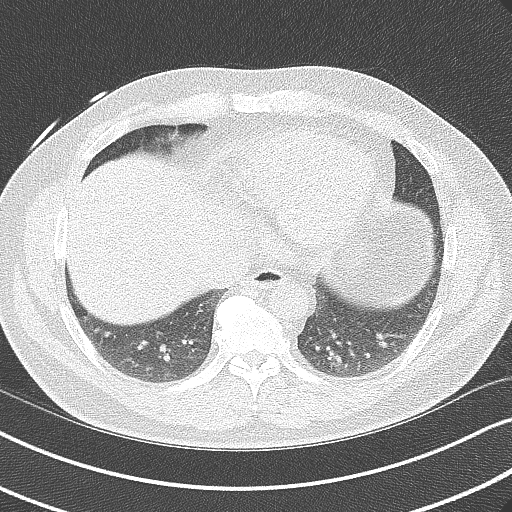
[im 18/36  lung]
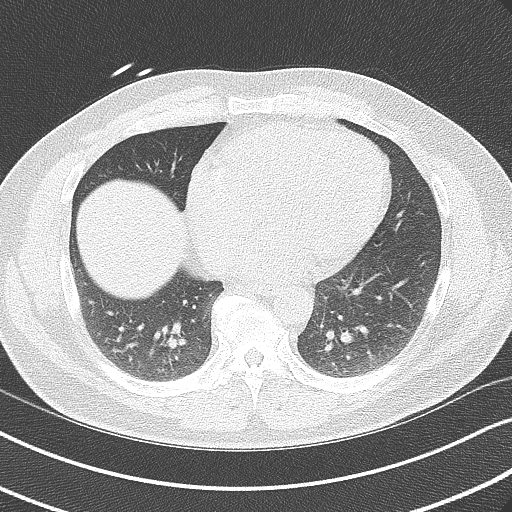
[im 24/36  lung]
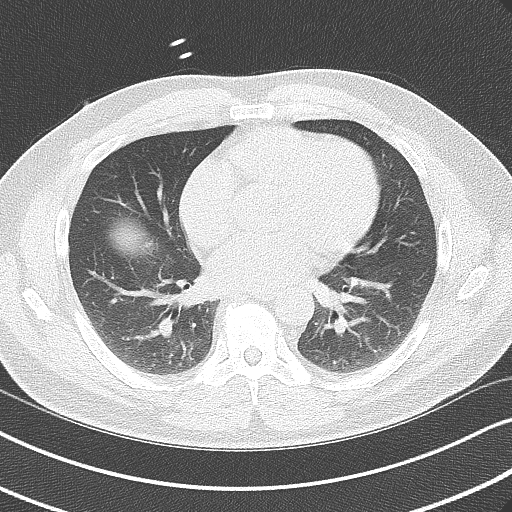
[im 30/36  lung]
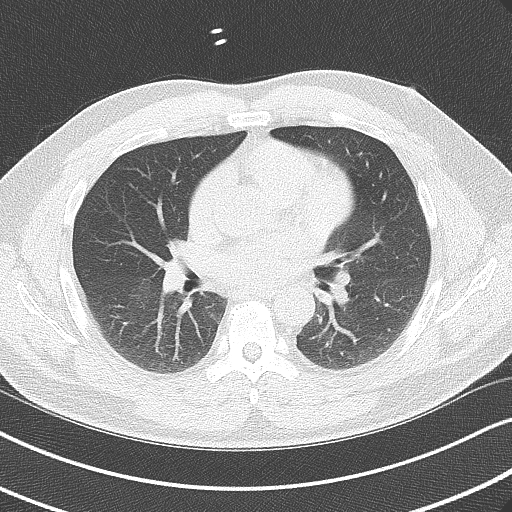

[Series 4: lung st 36 % · axial · 0.68mm/px · z∈[-316,-244]mm · 5 of 36 slices shown]
[im 6/36  lung]
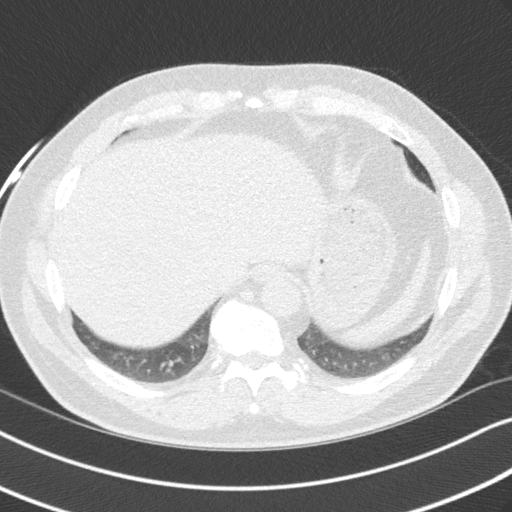
[im 12/36  lung]
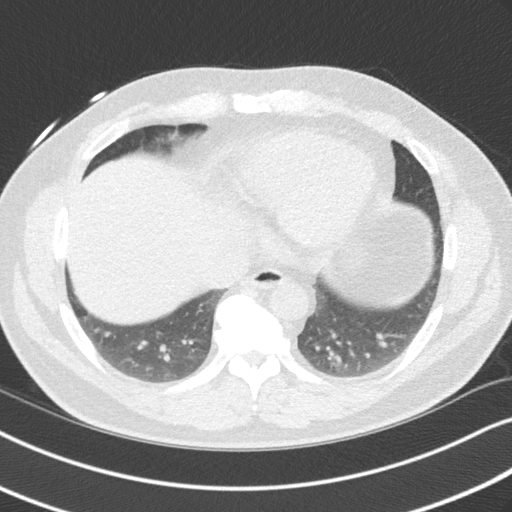
[im 18/36  lung]
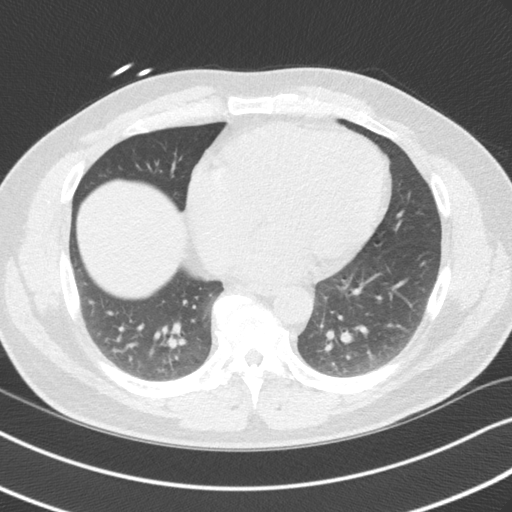
[im 24/36  lung]
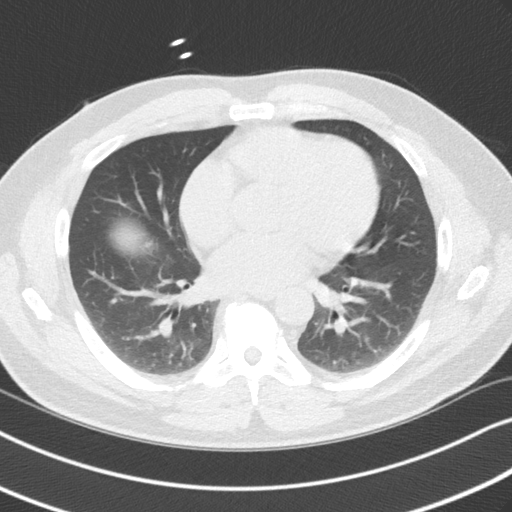
[im 30/36  lung]
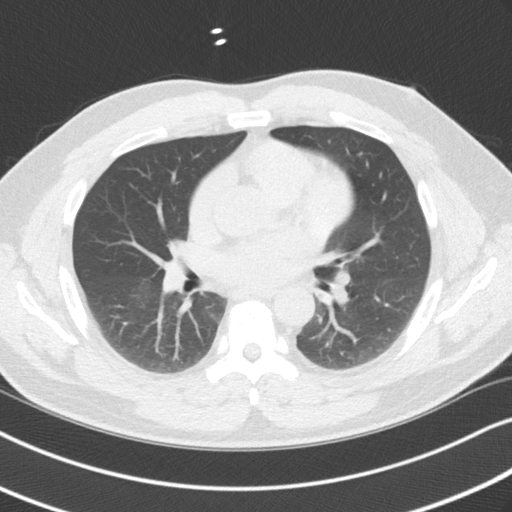

[14 of 20 positions shown; findings below may reference images not displayed]

FINDINGS: Within the visualized portions of the thorax there are no suspicious
appearing pulmonary nodules or masses, there is no acute
consolidative airspace disease, no pleural effusions, no
pneumothorax and no lymphadenopathy. Visualized portions of the
upper abdomen are unremarkable. There are no aggressive appearing
lytic or blastic lesions noted in the visualized portions of the
skeleton.
IMPRESSION: 1. No significant incidental noncardiac findings are noted.
FINDINGS: Non-cardiac: See separate report from [REDACTED].
IMPRESSION: Coronary calcium score of 11.1. This was 66 percentile for age and
sex matched control.
FINDINGS: Non-cardiac: See separate report from [REDACTED].
IMPRESSION: Coronary calcium score of 11.1. This was 66 percentile for age and
sex matched control.

*** End of Addendum ***
Addendum:
EXAM:
OVER-READ INTERPRETATION  CT CHEST

The following report is an over-read performed by radiologist Dr.
Kasukona Masiku [REDACTED] on 01/10/2021. This
over-read does not include interpretation of cardiac or coronary
anatomy or pathology. The coronary calcium score/coronary CTA
interpretation by the cardiologist is attached.
FINDINGS: Within the visualized portions of the thorax there are no suspicious
appearing pulmonary nodules or masses, there is no acute
consolidative airspace disease, no pleural effusions, no
pneumothorax and no lymphadenopathy. Visualized portions of the
upper abdomen are unremarkable. There are no aggressive appearing
lytic or blastic lesions noted in the visualized portions of the
skeleton.
IMPRESSION: 1. No significant incidental noncardiac findings are noted.
FINDINGS: Non-cardiac: See separate report from [REDACTED].
IMPRESSION: Coronary calcium score of 11.1. This was 66 percentile for age and
sex matched control.

*** End of Addendum ***
EXAM:
OVER-READ INTERPRETATION  CT CHEST

The following report is an over-read performed by radiologist Dr.
Kasukona Masiku [REDACTED] on 01/10/2021. This
over-read does not include interpretation of cardiac or coronary
anatomy or pathology. The coronary calcium score/coronary CTA
interpretation by the cardiologist is attached.
FINDINGS: Within the visualized portions of the thorax there are no suspicious
appearing pulmonary nodules or masses, there is no acute
consolidative airspace disease, no pleural effusions, no
pneumothorax and no lymphadenopathy. Visualized portions of the
upper abdomen are unremarkable. There are no aggressive appearing
lytic or blastic lesions noted in the visualized portions of the
skeleton.
IMPRESSION: 1. No significant incidental noncardiac findings are noted.

## 2022-09-26 ENCOUNTER — Other Ambulatory Visit: Payer: Self-pay | Admitting: Family Medicine

## 2022-09-26 DIAGNOSIS — I10 Essential (primary) hypertension: Secondary | ICD-10-CM

## 2023-03-23 ENCOUNTER — Other Ambulatory Visit: Payer: Self-pay | Admitting: Family Medicine

## 2023-03-23 DIAGNOSIS — I10 Essential (primary) hypertension: Secondary | ICD-10-CM

## 2023-03-23 NOTE — Telephone Encounter (Signed)
Called left detailed message needs to schedule an appointment Last OV was on 01/09/21

## 2023-03-23 NOTE — Telephone Encounter (Signed)
Did speak to the patient and he has scheduled on 04/17/2023. Sent in #30 no refills to give him enough until seen the end of the month.

## 2023-04-14 ENCOUNTER — Other Ambulatory Visit: Payer: Self-pay | Admitting: Family Medicine

## 2023-04-14 DIAGNOSIS — I10 Essential (primary) hypertension: Secondary | ICD-10-CM

## 2023-04-17 ENCOUNTER — Ambulatory Visit: Payer: BC Managed Care – PPO | Admitting: Family Medicine

## 2023-05-01 ENCOUNTER — Ambulatory Visit: Payer: BC Managed Care – PPO | Admitting: Family Medicine

## 2023-05-01 ENCOUNTER — Encounter: Payer: Self-pay | Admitting: Family Medicine

## 2023-05-01 VITALS — BP 160/88 | HR 77 | Temp 98.2°F | Ht 64.0 in | Wt 194.4 lb

## 2023-05-01 DIAGNOSIS — S29011A Strain of muscle and tendon of front wall of thorax, initial encounter: Secondary | ICD-10-CM | POA: Diagnosis not present

## 2023-05-01 DIAGNOSIS — I10 Essential (primary) hypertension: Secondary | ICD-10-CM

## 2023-05-01 MED ORDER — OLMESARTAN MEDOXOMIL 20 MG PO TABS: 10.0000 mg | ORAL_TABLET | Freq: Every day | ORAL | 2 refills | Status: DC

## 2023-05-01 NOTE — Progress Notes (Signed)
Chief Complaint  Patient presents with   Follow-up    medication    Subjective Willie Vargas is a 62 y.o. male who presents for hypertension follow up. He does not monitor home blood pressures. He is compliant with medications. Patient has these side effects of medication: none He is sometimes adhering to a healthy diet overall. Current exercise: none No CP or SOB.   1 year ago, the patient fell from an attic and was able to grab hold of the beam with his left upper extremity.  He did eventually fall but had some bruising and swelling of his left pectoral region.  Since then, there are some physical changes he had noticed such as mass no longer being where it was and the muscle seemingly balling up in his left pectoral region.  He is not having any pain or difficulty working or completing daily tasks.  No current bruising or swelling.  He has not lost any range of motion.   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  There is deformity/loss of mass over the proximal origin of what appears to be the pec minor. Psych: well oriented with normal range of affect and appropriate judgment/insight  Essential hypertension - Plan: olmesartan (BENICAR) 20 MG tablet  Pectoral muscle rupture, initial encounter  Chronic, uncontrolled.  Continue amlodipine 5 mg daily, add olmesartan 20 mg daily.  Monitor blood pressure at home.  Counseled on diet  and exercise. F/u in 2 weeks. Reassurance given due to the fact that he is not having any physical limitations or pain.  I did offer to refer him to a specialist if he would like their opinion moving forward.  He politely declined for now. The patient voiced understanding and agreement to the plan.  Jilda Roche Millsap, DO 05/01/23  2:28 PM

## 2023-05-01 NOTE — Patient Instructions (Signed)
Keep the diet clean and stay active.  Check your blood pressures 2-3 times per week, alternating the time of day you check it. If it is high, considering waiting 1-2 minutes and rechecking. If it gets higher, your anxiety is likely creeping up and we should avoid rechecking.   Aim to do some physical exertion for 150 minutes per week. This is typically divided into 5 days per week, 30 minutes per day. The activity should be enough to get your heart rate up. Anything is better than nothing if you have time constraints.  If you change your mind and wish to see a specialist for your pec, please let me know.   Let us know if you need anything.

## 2023-05-29 ENCOUNTER — Encounter: Payer: Self-pay | Admitting: Family Medicine

## 2023-05-29 ENCOUNTER — Ambulatory Visit: Payer: BC Managed Care – PPO | Admitting: Family Medicine

## 2023-05-29 VITALS — BP 146/90 | HR 68 | Temp 98.2°F | Ht 64.0 in | Wt 193.5 lb

## 2023-05-29 DIAGNOSIS — I10 Essential (primary) hypertension: Secondary | ICD-10-CM

## 2023-05-29 DIAGNOSIS — Z125 Encounter for screening for malignant neoplasm of prostate: Secondary | ICD-10-CM | POA: Diagnosis not present

## 2023-05-29 DIAGNOSIS — Z0000 Encounter for general adult medical examination without abnormal findings: Secondary | ICD-10-CM | POA: Diagnosis not present

## 2023-05-29 LAB — CBC
HCT: 38.8 % (ref 38.5–50.0)
Hemoglobin: 13.5 g/dL (ref 13.2–17.1)
MCH: 31.2 pg (ref 27.0–33.0)
MCHC: 34.8 g/dL (ref 32.0–36.0)
MCV: 89.6 fL (ref 80.0–100.0)
MPV: 10 fL (ref 7.5–12.5)
Platelets: 304 10*3/uL (ref 140–400)
RBC: 4.33 10*6/uL (ref 4.20–5.80)
RDW: 12.6 % (ref 11.0–15.0)
WBC: 5.8 10*3/uL (ref 3.8–10.8)

## 2023-05-29 MED ORDER — OLMESARTAN MEDOXOMIL 20 MG PO TABS
20.0000 mg | ORAL_TABLET | Freq: Every day | ORAL | Status: DC
Start: 2023-05-29 — End: 2023-07-27

## 2023-05-29 NOTE — Progress Notes (Signed)
Chief Complaint  Patient presents with   Annual Exam    Well Male Willie Vargas is here for a complete physical.   His last physical was >1 year ago.  Current diet: in general, a "healthy" diet.  Current exercise: lifting wts, walking Weight trend: stable Fatigue out of ordinary? No. Seat belt? Yes.   Advanced directive? Yes  Health maintenance Shingrix- No Colonoscopy- Yes Tetanus- Yes HIV- Yes Hep C- Yes  Hypertension Patient presents for hypertension follow up. He does monitor home blood pressures. Blood pressures ranging on average from 140-150's/70-80's. He is compliant with medications- Norvsac 5 mg/d, olmesartan 10 mg/d. Patient has these side effects of medication: none Diet/exercise as above.  No Cp or SOB.    Past Medical History:  Diagnosis Date   Allergy    Dyslipidemia 12/26/2020   Essential hypertension 12/26/2020   Palpitations 12/26/2020   Senile nuclear sclerosis 08/06/2021      Past Surgical History:  Procedure Laterality Date   ANTERIOR CRUCIATE LIGAMENT REPAIR  2015   WISDOM TOOTH EXTRACTION      Medications  Current Outpatient Medications on File Prior to Visit  Medication Sig Dispense Refill   amLODipine (NORVASC) 5 MG tablet Take 1 tablet (5 mg total) by mouth daily. 90 tablet 0   cetirizine (ZYRTEC) 10 MG tablet Take 10 mg by mouth daily.     Multiple Vitamins-Minerals (MULTIVITAMIN ADULTS PO) Take 1 tablet by mouth every other day.     Allergies No Known Allergies  Family History Family History  Problem Relation Age of Onset   COPD Mother    Diabetes Mother    Hyperlipidemia Mother    Hypertension Mother    Alcohol abuse Father    Drug abuse Father    Early death Father    Heart disease Father    Colon polyps Brother    Diabetes Brother    Arthritis Maternal Grandmother    Diabetes Maternal Grandmother    Hypertension Maternal Grandmother    Arthritis Paternal Grandmother    Early death Paternal Grandmother    Heart disease  Paternal Grandmother    Colon cancer Neg Hx    Esophageal cancer Neg Hx    Rectal cancer Neg Hx    Stomach cancer Neg Hx     Review of Systems: Constitutional:  no fevers Eye:  no recent significant change in vision Ear/Nose/Mouth/Throat:  Ears:  no hearing loss Nose/Mouth/Throat:  no complaints of nasal congestion, no sore throat Cardiovascular:  no chest pain Respiratory:  no shortness of breath Gastrointestinal:  no change in bowel habits GU:  Male: negative for dysuria, frequency Musculoskeletal/Extremities:  no joint pain Integumentary (Skin/Breast):  no abnormal skin lesions reported Neurologic:  no headaches Endocrine: No unexpected weight changes Hematologic/Lymphatic:  no abnormal bleeding  Exam BP (!) 146/90 (BP Location: Left Arm, Cuff Size: Normal)   Pulse 68   Temp 98.2 F (36.8 C) (Oral)   Ht 5\' 4"  (1.626 m)   Wt 193 lb 8 oz (87.8 kg)   SpO2 97%   BMI 33.21 kg/m  General:  well developed, well nourished, in no apparent distress Skin:  no significant moles, warts, or growths Head:  no masses, lesions, or tenderness Eyes:  pupils equal and round, sclera anicteric without injection Ears:  canals without lesions, TMs shiny without retraction, no obvious effusion, no erythema Nose:  nares patent, mucosa normal Throat/Pharynx:  lips and gingiva without lesion; tongue and uvula midline; non-inflamed pharynx; no exudates or  postnasal drainage Neck: neck supple without adenopathy, thyromegaly, or masses Cardiac: RRR, no bruits, no LE edema Lungs:  clear to auscultation, breath sounds equal bilaterally, no respiratory distress Abdomen: BS+, soft, non-tender, non-distended, no masses or organomegaly noted Rectal: Deferred Musculoskeletal:  symmetrical muscle groups noted without atrophy or deformity Neuro:  gait normal; deep tendon reflexes normal and symmetric Psych: well oriented with normal range of affect and appropriate judgment/insight  Assessment and  Plan  Well adult exam - Plan: CBC, Comprehensive metabolic panel, Lipid panel, Hemoglobin A1c  Screening for prostate cancer - Plan: PSA  Essential hypertension - Plan: olmesartan (BENICAR) 20 MG tablet   Well 62 y.o. male. Counseled on diet and exercise. Counseled on risks and benefits of prostate cancer screening with PSA. The patient agrees to undergo testing. Shingrix rec'd.  HTN: Chronic, uncontrolled. Increase Benicar from 10 mg/d to 20 mg/d. Cont Norvasc 5 mg/d. Send readings in 2 weeks. If still elevated, will increase to 40 mg/d. F/u in 1 mo.  Advanced directive form requested today.  Immunizations, labs, and further orders as above. Follow up in 1 mo. The patient voiced understanding and agreement to the plan.  Jilda Roche [place], DO 05/29/23 2:09 PM

## 2023-05-29 NOTE — Patient Instructions (Addendum)
Give [place] 2-3 business days to get the results of your labs back.   Keep the diet clean and stay active.  Please get me a copy of your advanced directive form at your convenience.   The Shingrix vaccine (for shingles) is a 2 shot series spaced 2-6 months apart. It can make people feel low energy, achy and almost like they have the flu for 48 hours after injection. 1/5 people can have nausea and/or vomiting. Please plan accordingly when deciding on when to get this shot. Call our office for a nurse visit appointment to get this. The second shot of the series is less severe regarding the side effects, but it still lasts 48 hours.   I recommend getting the flu shot in mid October. This suggestion would change if the CDC comes out with a different recommendation.   Send me a message in 2 weeks with some blood readings. We may make another medication tweak before our follow up.   Let us know if you need anything.

## 2023-06-04 ENCOUNTER — Encounter (INDEPENDENT_AMBULATORY_CARE_PROVIDER_SITE_OTHER): Payer: Self-pay

## 2023-07-17 ENCOUNTER — Ambulatory Visit: Payer: BC Managed Care – PPO | Admitting: Family Medicine

## 2023-07-17 ENCOUNTER — Other Ambulatory Visit: Payer: Self-pay | Admitting: Family Medicine

## 2023-07-17 DIAGNOSIS — I10 Essential (primary) hypertension: Secondary | ICD-10-CM

## 2023-07-27 ENCOUNTER — Ambulatory Visit (INDEPENDENT_AMBULATORY_CARE_PROVIDER_SITE_OTHER): Payer: BC Managed Care – PPO | Admitting: Family Medicine

## 2023-07-27 ENCOUNTER — Encounter: Payer: Self-pay | Admitting: Family Medicine

## 2023-07-27 ENCOUNTER — Ambulatory Visit: Payer: BC Managed Care – PPO | Admitting: Family Medicine

## 2023-07-27 VITALS — BP 132/86 | HR 55 | Temp 98.2°F | Ht 64.0 in | Wt 192.2 lb

## 2023-07-27 DIAGNOSIS — I10 Essential (primary) hypertension: Secondary | ICD-10-CM | POA: Diagnosis not present

## 2023-07-27 NOTE — Patient Instructions (Addendum)
We are done with the olmesartan.  Continue the amlodipine.  Keep the diet clean and stay active.  When you check your BP, make sure you have been doing something calm/relaxing 5 minutes prior to checking. Both feet should be flat on the floor and you should be sitting. Use your left arm and make sure it is in a relaxed position (on a table), and that the cuff is at the approximate level/height of your heart.  Check your blood pressures 2-3 times per week, alternating the time of day you check it. If it is high, considering waiting 1-2 minutes and rechecking. If it gets higher, your anxiety is likely creeping up and we should avoid rechecking.   If your numbers are the same even with doing this new approach, please send me a message in 3-4 weeks.   Aim to do some physical exertion for 150 minutes per week. This is typically divided into 5 days per week, 30 minutes per day. The activity should be enough to get your heart rate up. Anything is better than nothing if you have time constraints.  Let us know if you need anything.

## 2023-07-27 NOTE — Progress Notes (Signed)
Chief Complaint  Patient presents with   Follow-up    BP medication needs to be changed    Subjective Willie Vargas is a 62 y.o. male who presents for hypertension follow up. He does monitor home blood pressures. Blood pressures ranging from 140's/80's on average.  He watches a lot of the news involving politics throughout the day.  Usually sits for around 1 minute prior to checking his blood pressure. He is compliant with medications- Norvasc 5 mg/d, stopped olmesartan 20 mg/d. Patient has these side effects of medication: felt like pins in the LUE while on the olmesartan  He is usually adhering to a healthy diet overall. Current exercise: some walking No CP or SOB.    Past Medical History:  Diagnosis Date   Allergy    Dyslipidemia 12/26/2020   Essential hypertension 12/26/2020   Palpitations 12/26/2020   Senile nuclear sclerosis 08/06/2021    Exam BP 132/86 (BP Location: Left Arm, Cuff Size: Large)   Pulse (!) 55   Temp 98.2 F (36.8 C) (Oral)   Ht 5\' 4"  (1.626 m)   Wt 192 lb 4 oz (87.2 kg)   SpO2 99%   BMI 33.00 kg/m  General:  well developed, well nourished, in no apparent distress Heart: Bradycardic, regular rhythm, no bruits, no LE edema Lungs: clear to auscultation, no accessory muscle use Psych: well oriented with normal range of affect and appropriate judgment/insight  Essential hypertension  Adverse effect of medication.  Stop olmesartan due to side effects.  Continue amlodipine 5 mg daily.  He will monitor blood pressure at home in a more standardized fashion.  Instructions on how to do this were provided in his AVS.  Send a message in 3 to 4 weeks with readings and we will determine if he needs to start a new medication like hydrochlorothiazide 25 mg daily or can continue on amlodipine alone.  Counseled on diet and exercise. F/u pending above. The patient voiced understanding and agreement to the plan.  Jilda Roche [place], DO 07/27/23  2:09 PM

## 2023-10-06 ENCOUNTER — Ambulatory Visit: Payer: BC Managed Care – PPO | Attending: Cardiology | Admitting: Cardiology

## 2023-10-06 VITALS — BP 130/80 | HR 59 | Ht 64.0 in | Wt 189.0 lb

## 2023-10-06 DIAGNOSIS — R931 Abnormal findings on diagnostic imaging of heart and coronary circulation: Secondary | ICD-10-CM

## 2023-10-06 DIAGNOSIS — I10 Essential (primary) hypertension: Secondary | ICD-10-CM

## 2023-10-06 DIAGNOSIS — E785 Hyperlipidemia, unspecified: Secondary | ICD-10-CM

## 2023-10-06 MED ORDER — EZETIMIBE 10 MG PO TABS: 10.0000 mg | ORAL_TABLET | Freq: Every day | ORAL | 3 refills | Status: DC

## 2023-10-06 NOTE — Patient Instructions (Addendum)
 Medication Instructions:   START: Zetia 10mg  1 tablet daily   Lab Work: 3rd Floor   [address]  Your physician recommends that you return for lab work in:  6 weeks   You need to have labs done when you are fasting.  You can come Monday through Friday 8:00 am to 11:30AM and 1:00 to 4:00. You do not need to make an appointment as the order has already been placed.     Testing/Procedures: None Ordered   Follow-Up: At [facility], you and your health needs are our priority.  As part of our continuing mission to provide you with exceptional heart care, we have created designated Provider Care Teams.  These Care Teams include your primary Cardiologist (physician) and Advanced Practice Providers (APPs -  Physician Assistants and Nurse Practitioners) who all work together to provide you with the care you need, when you need it.  We recommend signing up for the patient portal called "MyChart".  Sign up information is provided on this After Visit Summary.  MyChart is used to connect with patients for Virtual Visits (Telemedicine).  Patients are able to view lab/test results, encounter notes, upcoming appointments, etc.  Non-urgent messages can be sent to your provider as well.   To learn more about what you can do with MyChart, go to [email or web address].    Your next appointment:   12 month(s)  The format for your next appointment:   In Person  Provider:   Gypsy Balsam, MD    Other Instructions NA

## 2023-10-06 NOTE — Progress Notes (Signed)
Cardiology Office Note:    Date:  10/06/2023   ID:  Willie Vargas, DOB 1961-07-26, MRN [id number]  PCP:  Sharlene Dory, DO  Cardiologist:  Gypsy Balsam, MD    Referring MD: Sharlene Dory*   Chief Complaint  Patient presents with   Follow-up    History of Present Illness:    Willie Vargas is a 62 y.o. male past medical history significant for essential hypertension, on medication, dyslipidemia, elevated calcium score 11.1.  Comes today to months for follow-up.  Overall doing well.  He denies have any chest pain, tightness, pressure, burning in the chest overall doing fine.  Past Medical History:  Diagnosis Date   Allergy    Dyslipidemia 12/26/2020   Essential hypertension 12/26/2020   Palpitations 12/26/2020   Senile nuclear sclerosis 08/06/2021    Past Surgical History:  Procedure Laterality Date   ANTERIOR CRUCIATE LIGAMENT REPAIR  2015   WISDOM TOOTH EXTRACTION      Current Medications: Current Meds  Medication Sig   amLODipine (NORVASC) 5 MG tablet TAKE 1 TABLET (5 MG TOTAL) BY MOUTH DAILY.   cetirizine (ZYRTEC) 10 MG tablet Take 10 mg by mouth daily.   ezetimibe (ZETIA) 10 MG tablet Take 1 tablet (10 mg total) by mouth daily.   Multiple Vitamins-Minerals (MULTIVITAMIN ADULTS PO) Take 1 tablet by mouth every other day.     Allergies:   Patient has no known allergies.   Social History   Socioeconomic History   Marital status: Legally Separated    Spouse name: Not on file   Number of children: Not on file   Years of education: Not on file   Highest education level: Not on file  Occupational History   Not on file  Tobacco Use   Smoking status: Never   Smokeless tobacco: Never  Vaping Use   Vaping status: Never Used  Substance and Sexual Activity   Alcohol use: No   Drug use: No   Sexual activity: Not on file  Other Topics Concern   Not on file  Social History Narrative   Not on file   Social Drivers of Health   Financial  Resource Strain: Not on file  Food Insecurity: Not on file  Transportation Needs: Not on file  Physical Activity: Not on file  Stress: No Stress Concern Present (08/07/2021)   Received from [facility], [facility]   [employer] of Occupational [facility] - Occupational Stress Questionnaire    Feeling of Stress : Not at all  Social Connections: Unknown (03/02/2022)   Received from [employer], [facility]   Social Network    Social Network: Not on file     Family History: The patient's family history includes Alcohol abuse in his father; Arthritis in his maternal grandmother and paternal grandmother; COPD in his mother; Colon polyps in his brother; Diabetes in his brother, maternal grandmother, and mother; Drug abuse in his father; Early death in his father and paternal grandmother; Heart disease in his father and paternal grandmother; Hyperlipidemia in his mother; Hypertension in his maternal grandmother and mother. There is no history of Colon cancer, Esophageal cancer, Rectal cancer, or Stomach cancer. ROS:   Please see the history of present illness.    All 14 point review of systems negative except as described per history of present illness  EKGs/Labs/Other Studies Reviewed:    EKG Interpretation Date/Time:  Tuesday October 06 2023 11:04:04 EST Ventricular Rate:  59 PR Interval:  144 QRS Duration:  84 QT Interval:  424 QTC Calculation: 419 R Axis:   14  Text Interpretation: Sinus bradycardia No previous ECGs available Confirmed by Gypsy Balsam [phone]) on 10/06/2023 11:25:35 AM    Recent Labs: 05/29/2023: ALT 20; BUN 13; Creat 1.04; Hemoglobin 13.5; Platelets 304; Potassium 4.2; Sodium 138  Recent Lipid Panel    Component Value Date/Time   CHOL 223 (H) 05/29/2023 1410   TRIG 64 05/29/2023 1410   HDL 49 05/29/2023 1410   CHOLHDL 4.6 05/29/2023 1410   VLDL 17.0 10/31/2019 0839   LDLCALC 158 (H) 05/29/2023 1410   LDLDIRECT 119 (H) 12/27/2021 0000     Physical Exam:    VS:  BP 130/80 (BP Location: Right Arm, Patient Position: Sitting)   Pulse (!) 59   Ht 5\' 4"  (1.626 m)   Wt 189 lb (85.7 kg)   SpO2 100%   BMI 32.44 kg/m     Wt Readings from Last 3 Encounters:  10/06/23 189 lb (85.7 kg)  07/27/23 192 lb 4 oz (87.2 kg)  05/29/23 193 lb 8 oz (87.8 kg)     GEN:  Well nourished, well developed in no acute distress HEENT: Normal NECK: No JVD; No carotid bruits LYMPHATICS: No lymphadenopathy CARDIAC: RRR, no murmurs, no rubs, no gallops RESPIRATORY:  Clear to auscultation without rales, wheezing or rhonchi  ABDOMEN: Soft, non-tender, non-distended MUSCULOSKELETAL:  No edema; No deformity  SKIN: Warm and dry LOWER EXTREMITIES: no swelling NEUROLOGIC:  Alert and oriented x 3 PSYCHIATRIC:  Normal affect   ASSESSMENT:    1. Essential hypertension   2. Dyslipidemia   3. Elevated coronary artery calcium score of 11    PLAN:    In order of problems listed above:  Essential hypertension: Blood pressure well-controlled continue present management. Dyslipidemia I did review K PN which show me his LDL 158 HDL 49 calculated 10 years presents with a is 8.2% which is intermediate.  His coronary age is 24.  I initiated conversation about potentially taking statin he does not want to do it we had a long discussion about his I explained him rational for it he agreed to try Zetia we will try Zetia 10 and see if it helps with his cholesterol. Elevated coronary artery calcium score discussion as described above, likely asymptomatic does not have any symptoms that would suggest obstructive disease   Medication Adjustments/Labs and Tests Ordered: Current medicines are reviewed at length with the patient today.  Concerns regarding medicines are outlined above.  Orders Placed This Encounter  Procedures   Lipid panel   EKG 12-Lead   Medication changes:  Meds ordered this encounter  Medications   ezetimibe (ZETIA) 10 MG tablet    Sig:  Take 1 tablet (10 mg total) by mouth daily.    Dispense:  90 tablet    Refill:  3    Signed, Georgeanna Lea, MD, [facility] 10/06/2023 12:08 PM    [facility]

## 2023-10-15 ENCOUNTER — Other Ambulatory Visit: Payer: Self-pay | Admitting: Family Medicine

## 2023-10-15 DIAGNOSIS — I10 Essential (primary) hypertension: Secondary | ICD-10-CM

## 2024-01-09 ENCOUNTER — Other Ambulatory Visit: Payer: Self-pay | Admitting: Family Medicine

## 2024-01-09 DIAGNOSIS — I10 Essential (primary) hypertension: Secondary | ICD-10-CM

## 2024-03-25 ENCOUNTER — Other Ambulatory Visit: Payer: Self-pay

## 2024-03-25 ENCOUNTER — Encounter: Payer: Self-pay | Admitting: Family Medicine

## 2024-03-25 ENCOUNTER — Ambulatory Visit: Payer: Self-pay | Admitting: Family Medicine

## 2024-03-25 VITALS — BP 130/80 | HR 71 | Temp 98.0°F | Resp 16 | Ht 64.0 in | Wt 184.0 lb

## 2024-03-25 DIAGNOSIS — I10 Essential (primary) hypertension: Secondary | ICD-10-CM

## 2024-03-25 DIAGNOSIS — E785 Hyperlipidemia, unspecified: Secondary | ICD-10-CM | POA: Diagnosis not present

## 2024-03-25 DIAGNOSIS — E7800 Pure hypercholesterolemia, unspecified: Secondary | ICD-10-CM

## 2024-03-25 DIAGNOSIS — R7303 Prediabetes: Secondary | ICD-10-CM | POA: Diagnosis not present

## 2024-03-25 LAB — LIPID PANEL
Cholesterol: 176 mg/dL (ref 0–200)
HDL: 51.4 mg/dL (ref >39.00)
LDL Cholesterol: 112 mg/dL — ABNORMAL HIGH (ref 0–99)
NonHDL: 125.07
Total CHOL/HDL Ratio: 3
Triglycerides: 67 mg/dL (ref 0.0–149.0)
VLDL: 13.4 mg/dL (ref 0.0–40.0)

## 2024-03-25 LAB — COMPREHENSIVE METABOLIC PANEL WITH GFR
ALT: 25 U/L (ref 0–53)
AST: 22 U/L (ref 0–37)
Albumin: 4.5 g/dL (ref 3.5–5.2)
Alkaline Phosphatase: 70 U/L (ref 39–117)
BUN: 15 mg/dL (ref 6–23)
CO2: 33 meq/L — ABNORMAL HIGH (ref 19–32)
Calcium: 9.2 mg/dL (ref 8.4–10.5)
Chloride: 101 meq/L (ref 96–112)
Creatinine, Ser: 1.05 mg/dL (ref 0.40–1.50)
GFR: 76.7 mL/min (ref >60.00)
Glucose, Bld: 93 mg/dL (ref 70–99)
Potassium: 4.2 meq/L (ref 3.5–5.1)
Sodium: 139 meq/L (ref 135–145)
Total Bilirubin: 0.7 mg/dL (ref 0.2–1.2)
Total Protein: 7.1 g/dL (ref 6.0–8.3)

## 2024-03-25 LAB — HEMOGLOBIN A1C: Hgb A1c MFr Bld: 6.1 % (ref 4.6–6.5)

## 2024-03-25 MED ORDER — ROSUVASTATIN CALCIUM 5 MG PO TABS: 5.0000 mg | ORAL_TABLET | Freq: Every day | ORAL | 3 refills | Status: AC

## 2024-03-25 NOTE — Addendum Note (Signed)
 Addended by: Blade Scheff M on: 03/25/2024 02:32 PM   Modules accepted: Orders

## 2024-03-25 NOTE — Progress Notes (Signed)
 Chief Complaint  Patient presents with   Medication Refill    Medication Refill    Subjective Willie Vargas is a 63 y.o. male who presents for hypertension follow up. He does not monitor home blood pressures. He is compliant with medication- Norvasc  5 mg/d. Patient has these side effects of medication: none He is usually adhering to a healthy diet overall. Current exercise: active at work No CP or SOB.   Hyperlipidemia Patient presents for dyslipidemia follow up. Currently being treated with Zetia  10 mg daily and compliance with treatment thus far has been good. He denies myalgias. Diet/exercise as above. The patient is known to have coexisting coronary artery disease.   Past Medical History:  Diagnosis Date   Allergy    Dyslipidemia 12/26/2020   Essential hypertension 12/26/2020   Palpitations 12/26/2020   Senile nuclear sclerosis 08/06/2021    Exam BP 130/80 (BP Location: Left Arm, Patient Position: Sitting)   Pulse 71   Temp 98 F (36.7 C) (Oral)   Resp 16   Ht 5\' 4"  (1.626 m)   Wt 184 lb (83.5 kg)   SpO2 98%   BMI 31.58 kg/m  General:  well developed, well nourished, in no apparent distress Heart: RRR, no bruits, no LE edema Lungs: clear to auscultation, no accessory muscle use Psych: well oriented with normal range of affect and appropriate judgment/insight  Essential hypertension  Dyslipidemia - Plan: Lipid panel, Comprehensive metabolic panel with GFR, rosuvastatin (CRESTOR) 5 MG tablet  Prediabetes - Plan: Hemoglobin A1c  Chronic, stable.  Continue Norvasc  5 mg daily.  Counseled on diet and exercise. Chronic, stable.  Did not do well with Zetia  from the cardiology team.  Will start Crestor 5 mg daily.  He is quite concerned about potential side effects from this medication.  We will have him do blood work in 6 weeks near his home. Cont to monitor.  F/u in 6 mo. The patient voiced understanding and agreement to the plan.  [name] [place],  DO 03/25/24  11:54 AM

## 2024-03-25 NOTE — Addendum Note (Signed)
 Addended by: Dariann Huckaba M on: 03/25/2024 03:45 PM   Modules accepted: Orders

## 2024-03-25 NOTE — Addendum Note (Signed)
 Addended by: Josslin Sanjuan M on: 03/25/2024 02:38 PM   Modules accepted: Orders

## 2024-03-25 NOTE — Patient Instructions (Addendum)
 Give us  2-3 business days to get the results of your labs back.   Keep the diet clean and stay active.  Heat (pad or rice pillow in microwave) over affected area, 10-15 minutes twice daily.   Ice/cold pack over area for 10-15 min twice daily.  OK to take Tylenol 1000 mg (2 extra strength tabs) or 975 mg (3 regular strength tabs) every 6 hours as needed.  Let us  know if you need anything.  Achilles Tendinitis Rehab It is normal to feel mild stretching, pulling, tightness, or discomfort as you do these exercises, but you should stop right away if you feel sudden pain or your pain gets worse.   Stretching and range of motion exercises These exercises warm up your muscles and joints and improve the movement and flexibility of your ankle. These exercises also help to relieve pain, numbness, and tingling. Exercise A: Standing wall calf stretch, knee straight  Stand with your hands against a wall. Extend your affected leg behind you and bend your front knee slightly. Keep both of your heels on the floor. Point the toes of your back foot slightly inward. Keeping your heels on the floor and your back knee straight, shift your weight toward the wall. Do not allow your back to arch. You should feel a gentle stretch in your calf. Hold this position for seconds. Repeat 2 times. Complete this stretch 3 times per week Exercise B: Standing wall calf stretch, knee bent Stand with your hands against a wall. Extend your affected leg behind you, and bend your front knee slightly. Keep both of your heels on the floor. Point the toes of your back foot slightly inward. Keeping your heels on the floor, unlock your back knee so that it is bent. You should feel a gentle stretch deep in your calf. Hold this position for 30 seconds. Repeat 2 times. Complete this stretch 3 times per week.  Strengthening exercises These exercises build strength and control of your ankle. Endurance is the ability to use your  muscles for a long time, even after they get tired. Exercise C: Plantar flexion with band  Sit on the floor with your affected leg extended. You may put a pillow under your calf to give your foot more room to move. Loop a rubber exercise band or tube around the ball of your affected foot. The ball of your foot is on the walking surface, right under your toes. The band or tube should be slightly tense when your foot is relaxed. If the band or tube slips, you can put on your shoe or put a washcloth between the band and your foot to help it stay in place. Slowly point your toes downward, pushing them away from you. Hold this position for 10 seconds. Slowly release the tension in the band or tube, controlling smoothly until your foot is back to the starting position. Repeat 2 times. Complete this exercise 3 times per week. Exercise D: Heel raise with eccentric lower  Stand on a step with the balls of your feet. The ball of your foot is on the walking surface, right under your toes. Do not put your heels on the step. For balance, rest your hands on the wall or on a railing. Rise up onto the balls of your feet. Keeping your heels up, shift all of your weight to your affected leg and pick up your other leg. Slowly lower your affected leg so your heel drops below the level of the step. Put  down your foot. If told by your health care provider, build up to: 3 sets of 15 repetitions while keeping your knees straight. 3 sets of 15 repetitions while keeping your knees bent as far as told by your health care provider.  Complete this exercise 3 times per week. If this exercise is too easy, try doing it while wearing a backpack with weights in it. Balance exercises These exercises improve or maintain your balance. Balance is important in preventing falls. Exercise E: Single leg stand Without shoes, stand near a railing or in a door frame. Hold on to the railing or door frame as needed. Stand on your  affected foot. Keep your big toe down on the floor and try to keep your arch lifted. Hold this position for 10 seconds. Repeat 2 times. Complete this exercise 3 times per week. If this exercise is too easy, you can try it with your eyes closed or while standing on a pillow. Make sure you discuss any questions you have with your health care provider. Document Released: 05/07/2005 Document Revised: 06/12/2016 Document Reviewed: 06/12/2015 Elsevier Interactive Patient Education  [employer].

## 2024-03-26 ENCOUNTER — Ambulatory Visit: Payer: Self-pay | Admitting: Family Medicine

## 2024-09-28 ENCOUNTER — Ambulatory Visit: Admitting: Family Medicine

## 2024-09-28 ENCOUNTER — Encounter: Payer: Self-pay | Admitting: Family Medicine

## 2024-09-28 ENCOUNTER — Ambulatory Visit: Payer: Self-pay | Admitting: Family Medicine

## 2024-09-28 VITALS — BP 142/70 | HR 75 | Temp 97.7°F | Resp 16 | Ht 64.0 in | Wt 190.6 lb

## 2024-09-28 DIAGNOSIS — Z23 Encounter for immunization: Secondary | ICD-10-CM

## 2024-09-28 DIAGNOSIS — Z0000 Encounter for general adult medical examination without abnormal findings: Secondary | ICD-10-CM

## 2024-09-28 DIAGNOSIS — R030 Elevated blood-pressure reading, without diagnosis of hypertension: Secondary | ICD-10-CM

## 2024-09-28 DIAGNOSIS — R7303 Prediabetes: Secondary | ICD-10-CM

## 2024-09-28 DIAGNOSIS — Z125 Encounter for screening for malignant neoplasm of prostate: Secondary | ICD-10-CM

## 2024-09-28 LAB — CBC
HCT: 41.7 % (ref 39.0–52.0)
Hemoglobin: 14.3 g/dL (ref 13.0–17.0)
MCHC: 34.3 g/dL (ref 30.0–36.0)
MCV: 91.5 fl (ref 78.0–100.0)
Platelets: 285 K/uL (ref 150.0–400.0)
RBC: 4.56 Mil/uL (ref 4.22–5.81)
RDW: 12.9 % (ref 11.5–15.5)
WBC: 4.4 K/uL (ref 4.0–10.5)

## 2024-09-28 LAB — LIPID PANEL
Cholesterol: 156 mg/dL (ref 0–200)
HDL: 48.2 mg/dL (ref >39.00)
LDL Cholesterol: 98 mg/dL (ref 0–99)
NonHDL: 107.55
Total CHOL/HDL Ratio: 3
Triglycerides: 50 mg/dL (ref 0.0–149.0)
VLDL: 10 mg/dL (ref 0.0–40.0)

## 2024-09-28 LAB — COMPREHENSIVE METABOLIC PANEL WITH GFR
ALT: 23 U/L (ref 0–53)
AST: 20 U/L (ref 0–37)
Albumin: 4.5 g/dL (ref 3.5–5.2)
Alkaline Phosphatase: 74 U/L (ref 39–117)
BUN: 12 mg/dL (ref 6–23)
CO2: 30 meq/L (ref 19–32)
Calcium: 9.1 mg/dL (ref 8.4–10.5)
Chloride: 103 meq/L (ref 96–112)
Creatinine, Ser: 1.03 mg/dL (ref 0.40–1.50)
GFR: 78.21 mL/min (ref >60.00)
Glucose, Bld: 106 mg/dL — ABNORMAL HIGH (ref 70–99)
Potassium: 4.4 meq/L (ref 3.5–5.1)
Sodium: 138 meq/L (ref 135–145)
Total Bilirubin: 0.6 mg/dL (ref 0.2–1.2)
Total Protein: 6.9 g/dL (ref 6.0–8.3)

## 2024-09-28 LAB — HEMOGLOBIN A1C: Hgb A1c MFr Bld: 5.9 % (ref 4.6–6.5)

## 2024-09-28 LAB — PSA: PSA: 2.04 ng/mL (ref 0.10–4.00)

## 2024-09-28 NOTE — Addendum Note (Signed)
 Addended by: Elfreda Blanchet M on: 09/28/2024 10:51 AM   Modules accepted: Orders

## 2024-09-28 NOTE — Progress Notes (Signed)
 Chief Complaint  Patient presents with   Annual Exam    Well Male Willie Vargas is here for a complete physical.   His last physical was >1 year ago.  Current diet: in general, a healthy diet.  Current exercise: active at work Weight trend: stable Fatigue out of ordinary? No. Seat belt? Yes.   Advanced directive? Yes  Health maintenance Shingrix- No Colonoscopy- Yes Tetanus- Yes HIV- Yes Hep C- Yes PCV- Due   Past Medical History:  Diagnosis Date   Allergy    Dyslipidemia 12/26/2020   Essential hypertension 12/26/2020   Palpitations 12/26/2020   Senile nuclear sclerosis 08/06/2021     Past Surgical History:  Procedure Laterality Date   ANTERIOR CRUCIATE LIGAMENT REPAIR  2015   WISDOM TOOTH EXTRACTION      Medications  Current Outpatient Medications on File Prior to Visit  Medication Sig Dispense Refill   amLODipine  (NORVASC ) 5 MG tablet TAKE 1 TABLET (5 MG TOTAL) BY MOUTH DAILY. 90 tablet 0   cetirizine (ZYRTEC) 10 MG tablet Take 10 mg by mouth daily.     Multiple Vitamins-Minerals (MULTIVITAMIN ADULTS PO) Take 1 tablet by mouth every other day.     rosuvastatin  (CRESTOR ) 5 MG tablet Take 1 tablet (5 mg total) by mouth daily. 90 tablet 3   Allergies No Known Allergies  Family History Family History  Problem Relation Age of Onset   COPD Mother    Diabetes Mother    Hyperlipidemia Mother    Hypertension Mother    Alcohol abuse Father    Drug abuse Father    Early death Father    Heart disease Father    Colon polyps Brother    Diabetes Brother    Arthritis Maternal Grandmother    Diabetes Maternal Grandmother    Hypertension Maternal Grandmother    Arthritis Paternal Grandmother    Early death Paternal Grandmother    Heart disease Paternal Grandmother    Colon cancer Neg Hx    Esophageal cancer Neg Hx    Rectal cancer Neg Hx    Stomach cancer Neg Hx     Review of Systems: Constitutional:  no fevers Eye:  no recent significant change in  vision Ear/Nose/Mouth/Throat:  Ears:  no hearing loss Nose/Mouth/Throat:  no complaints of nasal congestion, no sore throat Cardiovascular:  no chest pain Respiratory:  no shortness of breath Gastrointestinal:  no change in bowel habits GU:  Male: negative for dysuria, frequency Musculoskeletal/Extremities:  no joint pain Integumentary (Skin/Breast):  no abnormal skin lesions reported Neurologic:  no headaches Endocrine: No unexpected weight changes Hematologic/Lymphatic:  no abnormal bleeding  Exam BP (!) 142/70 (BP Location: Right Arm, Patient Position: Sitting)   Pulse 75   Temp 97.7 F (36.5 C) (Oral)   Resp 16   Ht 5' 4 (1.626 m)   Wt 190 lb 9.6 oz (86.5 kg)   SpO2 97%   BMI 32.72 kg/m  General:  well developed, well nourished, in no apparent distress Skin:  no significant moles, warts, or growths Head:  no masses, lesions, or tenderness Eyes:  pupils equal and round, sclera anicteric without injection Ears:  canals without lesions, TMs shiny without retraction, no obvious effusion, no erythema Nose:  nares patent, mucosa normal Throat/Pharynx:  lips and gingiva without lesion; tongue and uvula midline; non-inflamed pharynx; no exudates or postnasal drainage Neck: neck supple without adenopathy, thyromegaly, or masses Cardiac: RRR, no bruits, no LE edema Lungs:  clear to auscultation, breath sounds equal  bilaterally, no respiratory distress Abdomen: BS+, soft, non-tender, non-distended, no masses or organomegaly noted Rectal: Deferred Musculoskeletal:  symmetrical muscle groups noted without atrophy or deformity Neuro:  gait normal; deep tendon reflexes normal and symmetric Psych: well oriented with normal range of affect and appropriate judgment/insight  Assessment and Plan  Well adult exam - Plan: CBC, Comprehensive metabolic panel with GFR, Lipid panel  Screening for prostate cancer - Plan: PSA   Well 63 y.o. male. Counseled on diet and exercise. Counseled on  risks and benefits of prostate cancer screening with PSA. The patient agrees to undergo testing. Advanced directive form provided today.  Flu shot today. PCV20 today.  HTN: Monitor BP at home. Send message in 1 mo w readings.  Immunizations, labs, and further orders as above. Follow up in 6 mo. The patient voiced understanding and agreement to the plan.  Mabel Mt [place], DO 09/28/24 10:03 AM

## 2024-09-28 NOTE — Patient Instructions (Addendum)
 Give us  2-3 business days to get the results of your labs back.   Keep the diet clean and stay active.  Please get me a copy of your advanced directive form at your convenience.   Check your blood pressures 2-3 times per week, alternating the time of day you check it. If it is high, considering waiting 1-2 minutes and rechecking. If it gets higher, your anxiety is likely creeping up and we should avoid rechecking. Send me a message in a month with your readings.   Artificial tears like Refresh and Systane may be used for comfort. OK to get generic version. Generally people use them every 2-4 hours, but you can use them as much as you want because there is no medication in it.  The Shingrix vaccine (for shingles) is a 2 shot series spaced 2-6 months apart. It can make people feel low energy, achy and almost like they have the flu for 48 hours after injection. 1/5 people can have nausea and/or vomiting. Please plan accordingly when deciding on when to get this shot. Call our office for a nurse visit appointment to get this. The second shot of the series is less severe regarding the side effects, but it still lasts 48 hours.   Let us  know if you need anything.

## 2024-11-08 ENCOUNTER — Ambulatory Visit: Attending: Cardiology | Admitting: Cardiology

## 2024-11-08 ENCOUNTER — Encounter: Payer: Self-pay | Admitting: Cardiology

## 2024-11-08 VITALS — BP 164/92 | HR 76 | Ht 64.0 in | Wt 189.0 lb

## 2024-11-08 DIAGNOSIS — R931 Abnormal findings on diagnostic imaging of heart and coronary circulation: Secondary | ICD-10-CM | POA: Diagnosis not present

## 2024-11-08 DIAGNOSIS — R002 Palpitations: Secondary | ICD-10-CM

## 2024-11-08 DIAGNOSIS — I10 Essential (primary) hypertension: Secondary | ICD-10-CM

## 2024-11-08 DIAGNOSIS — E785 Hyperlipidemia, unspecified: Secondary | ICD-10-CM | POA: Diagnosis not present

## 2024-11-08 MED ORDER — AMLODIPINE BESYLATE 5 MG PO TABS: 5.0000 mg | ORAL_TABLET | Freq: Every day | ORAL | 3 refills | Status: AC

## 2024-11-08 NOTE — Patient Instructions (Signed)
 Medication Instructions:  Your physician has recommended you make the following change in your medication:  Start Amlodipine  5 mg once daily  *If you need a refill on your cardiac medications before your next appointment, please call your pharmacy*  Lab Work: NONE If you have labs (blood work) drawn today and your tests are completely normal, you will receive your results only by: MyChart Message (if you have MyChart) OR A paper copy in the mail If you have any lab test that is abnormal or we need to change your treatment, we will call you to review the results.  Testing/Procedures: NONE  Follow-Up: At [facility], you and your health needs are our priority.  As part of our continuing mission to provide you with exceptional heart care, our providers are all part of one team.  This team includes your primary Cardiologist (physician) and Advanced Practice Providers or APPs (Physician Assistants and Nurse Practitioners) who all work together to provide you with the care you need, when you need it.  Your next appointment:   1 year(s)  Provider:   Lamar Fitch, MD    We recommend signing up for the patient portal called MyChart.  Sign up information is provided on this After Visit Summary.  MyChart is used to connect with patients for Virtual Visits (Telemedicine).  Patients are able to view lab/test results, encounter notes, upcoming appointments, etc.  Non-urgent messages can be sent to your provider as well.   To learn more about what you can do with MyChart, go to [email or web address].   Other Instructions

## 2024-11-08 NOTE — Progress Notes (Unsigned)
 " Cardiology Office Note:    Date:  11/08/2024   ID:  Willie Vargas, DOB [birth year], MRN [id number]  PCP:  Frann Mabel Mt, DO  Cardiologist:  Lamar Fitch, MD    Referring MD: Frann Mabel Mt, DO   Chief Complaint  Patient presents with   Annual Exam    History of Present Illness:     Willie Vargas is a 64 y.o. male past medical history significant for essential hypertension, dyslipidemia, elevated calcium  score 11.1.  Comes today to my office on a yearly follow-up cardiac wise doing well without any symptoms there is no chest pain tightness squeezing pressure burning chest, he can walk climb stairs with no difficulties.  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  Past Medical History:  Diagnosis Date   Allergy    Dyslipidemia 12/26/2020   Essential hypertension 12/26/2020   Palpitations 12/26/2020   Senile nuclear sclerosis 08/06/2021    Past Surgical History:  Procedure Laterality Date   ANTERIOR CRUCIATE LIGAMENT REPAIR  2015   WISDOM TOOTH EXTRACTION      Current Medications: Active Medications[1]   Allergies:   Patient has no known allergies.   Social History   Socioeconomic History   Marital status: Legally Separated    Spouse name: Not on file   Number of children: Not on file   Years of education: Not on file   Highest education level: Not on file  Occupational History   Not on file  Tobacco Use   Smoking status: Never   Smokeless tobacco: Never  Vaping Use   Vaping status: Never Used  Substance and Sexual Activity   Alcohol use: No   Drug use: No   Sexual activity: Not on file  Other Topics Concern   Not on file  Social History Narrative   Not on file   Social Drivers of Health   Tobacco Use: Low Risk (11/08/2024)   Patient History    Smoking Tobacco Use: Never    Smokeless Tobacco Use: Never    Passive Exposure: Not on file  Financial Resource Strain: Not on file   Food Insecurity: Not on file  Transportation Needs: Not on file  Physical Activity: Not on file  Stress: Not on file  Social Connections: Not on file  Depression (PHQ2-9): Low Risk (09/28/2024)   Depression (PHQ2-9)    PHQ-2 Score: 0  Alcohol Screen: Not on file  Housing: Not on file  Utilities: Not on file  Health Literacy: Not on file     Family History: The patient's family history includes Alcohol abuse in his father; Arthritis in his maternal grandmother and paternal grandmother; COPD in his mother; Colon polyps in his brother; Diabetes in his brother, maternal grandmother, and mother; Drug abuse in his father; Early death in his father and paternal grandmother; Heart disease in his father and paternal grandmother; Hyperlipidemia in his mother; Hypertension in his maternal grandmother and mother. There is no history of Colon cancer, Esophageal cancer, Rectal cancer, or Stomach cancer. ROS:   Please see the history of present illness.    All 14 point review of systems negative except as described per history of present illness  EKGs/Labs/Other Studies Reviewed:         Recent Labs: 09/28/2024: ALT 23; BUN 12; Creatinine, Ser 1.03; Hemoglobin 14.3; Platelets 285.0; Potassium 4.4; Sodium 138  Recent Lipid Panel    Component Value Date/Time   CHOL  156 09/28/2024 1035   TRIG 50.0 09/28/2024 1035   HDL 48.20 09/28/2024 1035   CHOLHDL 3 09/28/2024 1035   VLDL 10.0 09/28/2024 1035   LDLCALC 98 09/28/2024 1035   LDLCALC 158 (H) 05/29/2023 1410   LDLDIRECT 119 (H) 12/27/2021 0000    Physical Exam:    VS:  BP (!) 164/92   Pulse 76   Ht 5' 4 (1.626 m)   Wt 189 lb (85.7 kg)   SpO2 99%   BMI 32.44 kg/m     Wt Readings from Last 3 Encounters:  11/08/24 189 lb (85.7 kg)  09/28/24 190 lb 9.6 oz (86.5 kg)  03/25/24 184 lb (83.5 kg)     GEN:  Well nourished, well developed in no acute distress HEENT: Normal NECK: No JVD; No carotid bruits LYMPHATICS: No  lymphadenopathy CARDIAC: RRR, no murmurs, no rubs, no gallops RESPIRATORY:  Clear to auscultation without rales, wheezing or rhonchi  ABDOMEN: Soft, non-tender, non-distended MUSCULOSKELETAL:  No edema; No deformity  SKIN: Warm and dry LOWER EXTREMITIES: no swelling NEUROLOGIC:  Alert and oriented x 3 PSYCHIATRIC:  Normal affect   ASSESSMENT:    1. Essential hypertension   2. Elevated coronary artery calcium  score of 11   3. Dyslipidemia   4. Palpitations    PLAN:    In order of problems listed above:  Essential hypertension clearly uncontrolled.  He was taking amlodipine  but that the medication has been withdrawn he check his blood pressure on the regular basis usually high enough that there is some justification for the medications I will ask him to go back on amlodipine  5 mg daily since he tolerated this quite well and blood pressure was well-controlled. Dyslipidemia I did review [facility] I do have data from December of last year with LDL of 98 HDL 48 after that he was put on 5 mg Crestor  that he takes we will follow-up with another fasting lipid profile. Palpitations denies having any. Overall he is doing well I encouraged him to be active.  I will see him back in 1 year however he tells me that he would like to move his care to [facility].   Medication Adjustments/Labs and Tests Ordered: Current medicines are reviewed at length with the patient today.  Concerns regarding medicines are outlined above.  Orders Placed This Encounter  Procedures   EKG 12-Lead   Medication changes: No orders of the defined types were placed in this encounter.   Signed, Lamar DOROTHA Fitch, MD, [facility] 11/08/2024 9:47 AM    [facility]    [1]  Current Meds  Medication Sig   cetirizine (ZYRTEC) 10 MG tablet Take 10 mg by mouth daily.   rosuvastatin  (CRESTOR ) 5 MG tablet Take 1 tablet (5 mg total) by mouth daily.   "
